# Patient Record
Sex: Male | Born: 1940
Health system: Southern US, Community
[De-identification: ages and names within clinical notes are randomized; demographics above are authoritative.]

## PROBLEM LIST (undated history)

## (undated) DIAGNOSIS — Z0000 Encounter for general adult medical examination without abnormal findings: Secondary | ICD-10-CM

## (undated) DIAGNOSIS — Z860101 Personal history of adenomatous and serrated colon polyps: Secondary | ICD-10-CM

## (undated) DIAGNOSIS — G629 Polyneuropathy, unspecified: Secondary | ICD-10-CM

## (undated) DIAGNOSIS — R9431 Abnormal electrocardiogram [ECG] [EKG]: Secondary | ICD-10-CM

## (undated) DIAGNOSIS — E119 Type 2 diabetes mellitus without complications: Secondary | ICD-10-CM

## (undated) DIAGNOSIS — F17200 Nicotine dependence, unspecified, uncomplicated: Secondary | ICD-10-CM

## (undated) DIAGNOSIS — C4490 Unspecified malignant neoplasm of skin, unspecified: Secondary | ICD-10-CM

## (undated) DIAGNOSIS — K635 Polyp of colon: Secondary | ICD-10-CM

## (undated) DIAGNOSIS — Z8719 Personal history of other diseases of the digestive system: Secondary | ICD-10-CM

## (undated) DIAGNOSIS — M1990 Unspecified osteoarthritis, unspecified site: Secondary | ICD-10-CM

## (undated) DIAGNOSIS — M5430 Sciatica, unspecified side: Secondary | ICD-10-CM

## (undated) DIAGNOSIS — I10 Essential (primary) hypertension: Secondary | ICD-10-CM

## (undated) DIAGNOSIS — N189 Chronic kidney disease, unspecified: Secondary | ICD-10-CM

## (undated) DIAGNOSIS — J00 Acute nasopharyngitis [common cold]: Secondary | ICD-10-CM

## (undated) DIAGNOSIS — C61 Malignant neoplasm of prostate: Secondary | ICD-10-CM

## (undated) DIAGNOSIS — E669 Obesity, unspecified: Secondary | ICD-10-CM

## (undated) DIAGNOSIS — E785 Hyperlipidemia, unspecified: Secondary | ICD-10-CM

## (undated) DIAGNOSIS — Z86010 Personal history of colonic polyps: Secondary | ICD-10-CM

## (undated) HISTORY — DX: Encounter for general adult medical examination without abnormal findings: Z00.00

## (undated) HISTORY — DX: Unspecified osteoarthritis, unspecified site: M19.90

## (undated) HISTORY — DX: Polyp of colon: K63.5

## (undated) HISTORY — DX: Nicotine dependence, unspecified, uncomplicated: F17.200

## (undated) HISTORY — DX: Polyneuropathy, unspecified: G62.9

## (undated) HISTORY — DX: Hyperlipidemia, unspecified: E78.5

## (undated) HISTORY — PX: CATARACT EXTRACTION: SUR2

## (undated) HISTORY — DX: Sciatica, unspecified side: M54.30

## (undated) HISTORY — DX: Unspecified malignant neoplasm of skin, unspecified: C44.90

## (undated) HISTORY — DX: Personal history of other diseases of the digestive system: Z87.19

## (undated) HISTORY — DX: Obesity, unspecified: E66.9

## (undated) HISTORY — DX: Essential (primary) hypertension: I10

---

## 1968-10-01 DIAGNOSIS — T148XXA Other injury of unspecified body region, initial encounter: Secondary | ICD-10-CM

## 1968-10-01 HISTORY — PX: LEG SURGERY: SHX1003

## 1968-10-01 HISTORY — DX: Other injury of unspecified body region, initial encounter: T14.8XXA

## 1976-10-01 HISTORY — PX: APPENDECTOMY: SHX54

## 1998-09-19 ENCOUNTER — Encounter: Admission: RE | Admit: 1998-09-19 | Discharge: 1998-12-18 | Payer: Self-pay | Admitting: Internal Medicine

## 2006-03-26 ENCOUNTER — Ambulatory Visit: Payer: Self-pay | Admitting: Internal Medicine

## 2006-04-04 ENCOUNTER — Ambulatory Visit: Payer: Self-pay | Admitting: Gastroenterology

## 2006-05-29 ENCOUNTER — Ambulatory Visit: Payer: Self-pay | Admitting: Internal Medicine

## 2006-06-05 ENCOUNTER — Ambulatory Visit: Payer: Self-pay | Admitting: Internal Medicine

## 2008-05-14 ENCOUNTER — Ambulatory Visit: Payer: Self-pay | Admitting: Internal Medicine

## 2008-05-14 DIAGNOSIS — L570 Actinic keratosis: Secondary | ICD-10-CM | POA: Insufficient documentation

## 2008-05-14 DIAGNOSIS — Z860100 Personal history of colon polyps, unspecified: Secondary | ICD-10-CM | POA: Insufficient documentation

## 2008-05-14 DIAGNOSIS — I10 Essential (primary) hypertension: Secondary | ICD-10-CM

## 2008-05-18 ENCOUNTER — Ambulatory Visit: Payer: Self-pay | Admitting: Internal Medicine

## 2008-05-18 ENCOUNTER — Encounter: Payer: Self-pay | Admitting: Internal Medicine

## 2008-05-18 LAB — CONVERTED CEMR LAB
ALT: 34 units/L (ref 0–53)
AST: 29 units/L (ref 0–37)
Albumin: 3.9 g/dL (ref 3.5–5.2)
BUN: 23 mg/dL (ref 6–23)
Bilirubin Urine: NEGATIVE
CO2: 27 meq/L (ref 19–32)
Creatinine, Ser: 1.4 mg/dL (ref 0.4–1.5)
Eosinophils Absolute: 0.1 10*3/uL (ref 0.0–0.7)
GFR calc Af Amer: 65 mL/min
GFR calc non Af Amer: 54 mL/min
Glucose, Bld: 98 mg/dL (ref 70–99)
HCT: 43.3 % (ref 39.0–52.0)
HDL: 35.4 mg/dL — ABNORMAL LOW (ref >39.0)
Hemoglobin, Urine: NEGATIVE
Monocytes Relative: 5.8 % (ref 3.0–12.0)
Neutro Abs: 3.8 10*3/uL (ref 1.4–7.7)
Potassium: 4.3 meq/L (ref 3.5–5.1)
Total Bilirubin: 1.3 mg/dL — ABNORMAL HIGH (ref 0.3–1.2)
Total CHOL/HDL Ratio: 4.7
Total Protein, Urine: NEGATIVE mg/dL
Total Protein: 6.7 g/dL (ref 6.0–8.3)
Triglycerides: 142 mg/dL (ref 0–149)
Urobilinogen, UA: 0.2 (ref 0.0–1.0)
VLDL: 28 mg/dL (ref 0–40)

## 2008-05-20 LAB — CONVERTED CEMR LAB: Vit D, 1,25-Dihydroxy: 32 (ref 30–89)

## 2008-06-10 ENCOUNTER — Ambulatory Visit: Payer: Self-pay | Admitting: Gastroenterology

## 2008-06-21 ENCOUNTER — Telehealth: Payer: Self-pay | Admitting: Gastroenterology

## 2008-06-24 ENCOUNTER — Ambulatory Visit: Payer: Self-pay | Admitting: Gastroenterology

## 2008-09-09 ENCOUNTER — Ambulatory Visit: Payer: Self-pay | Admitting: Internal Medicine

## 2008-09-09 ENCOUNTER — Encounter: Payer: Self-pay | Admitting: Internal Medicine

## 2008-09-09 DIAGNOSIS — R05 Cough: Secondary | ICD-10-CM

## 2008-09-09 DIAGNOSIS — J209 Acute bronchitis, unspecified: Secondary | ICD-10-CM

## 2008-10-28 ENCOUNTER — Ambulatory Visit: Payer: Self-pay | Admitting: Internal Medicine

## 2008-11-04 ENCOUNTER — Ambulatory Visit: Payer: Self-pay | Admitting: Internal Medicine

## 2008-11-04 LAB — CONVERTED CEMR LAB
ALT: 75 units/L — ABNORMAL HIGH (ref 0–53)
Alkaline Phosphatase: 52 units/L (ref 39–117)
CO2: 0 meq/L — CL (ref 19–32)
Calcium: 10.1 mg/dL (ref 8.4–10.5)
Cholesterol: 194 mg/dL (ref 0–200)
Creatinine, Ser: 1.4 mg/dL (ref 0.4–1.5)
GFR calc non Af Amer: 54 mL/min
HDL: 44.2 mg/dL (ref >39.0)
Hgb A1c MFr Bld: 5.5 % (ref 4.6–6.0)
LDL Cholesterol: 120 mg/dL — ABNORMAL HIGH (ref 0–99)
Microalb Creat Ratio: 5.5 mg/g (ref 0.0–30.0)
Microalb, Ur: 0.7 mg/dL (ref 0.0–1.9)
Potassium: 4.8 meq/L (ref 3.5–5.1)
Sodium: 143 meq/L (ref 135–145)
TSH: 1.02 microintl units/mL (ref 0.35–5.50)
Total CHOL/HDL Ratio: 4.4
VLDL: 30 mg/dL (ref 0–40)

## 2008-11-05 ENCOUNTER — Encounter: Payer: Self-pay | Admitting: Internal Medicine

## 2009-01-27 ENCOUNTER — Ambulatory Visit: Payer: Self-pay | Admitting: Internal Medicine

## 2009-01-27 DIAGNOSIS — R748 Abnormal levels of other serum enzymes: Secondary | ICD-10-CM | POA: Insufficient documentation

## 2009-01-31 ENCOUNTER — Encounter: Payer: Self-pay | Admitting: Internal Medicine

## 2009-02-21 ENCOUNTER — Ambulatory Visit: Payer: Self-pay | Admitting: Internal Medicine

## 2009-02-21 DIAGNOSIS — J018 Other acute sinusitis: Secondary | ICD-10-CM | POA: Insufficient documentation

## 2009-02-21 DIAGNOSIS — K8020 Calculus of gallbladder without cholecystitis without obstruction: Secondary | ICD-10-CM | POA: Insufficient documentation

## 2009-02-23 ENCOUNTER — Ambulatory Visit: Payer: Self-pay | Admitting: Diagnostic Radiology

## 2009-02-23 ENCOUNTER — Ambulatory Visit (HOSPITAL_BASED_OUTPATIENT_CLINIC_OR_DEPARTMENT_OTHER): Admission: RE | Admit: 2009-02-23 | Discharge: 2009-02-23 | Payer: Self-pay | Admitting: Internal Medicine

## 2009-02-25 ENCOUNTER — Telehealth: Payer: Self-pay | Admitting: Internal Medicine

## 2010-05-01 ENCOUNTER — Telehealth: Payer: Self-pay | Admitting: Internal Medicine

## 2010-05-01 ENCOUNTER — Ambulatory Visit: Payer: Self-pay | Admitting: Internal Medicine

## 2010-05-01 DIAGNOSIS — R7309 Other abnormal glucose: Secondary | ICD-10-CM

## 2010-05-01 LAB — CONVERTED CEMR LAB
CO2: 25 meq/L (ref 19–32)
Chloride: 106 meq/L (ref 96–112)
Creatinine, Ser: 1.62 mg/dL — ABNORMAL HIGH (ref 0.40–1.50)
Potassium: 4.7 meq/L (ref 3.5–5.3)
Sodium: 141 meq/L (ref 135–145)

## 2010-05-02 ENCOUNTER — Telehealth: Payer: Self-pay | Admitting: Internal Medicine

## 2010-05-02 ENCOUNTER — Ambulatory Visit: Payer: Self-pay | Admitting: Radiology

## 2010-05-02 ENCOUNTER — Ambulatory Visit (HOSPITAL_BASED_OUTPATIENT_CLINIC_OR_DEPARTMENT_OTHER): Admission: RE | Admit: 2010-05-02 | Discharge: 2010-05-02 | Payer: Self-pay | Admitting: Internal Medicine

## 2010-05-02 DIAGNOSIS — N259 Disorder resulting from impaired renal tubular function, unspecified: Secondary | ICD-10-CM | POA: Insufficient documentation

## 2010-10-02 ENCOUNTER — Encounter: Payer: Self-pay | Admitting: Internal Medicine
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
## 2010-10-31 NOTE — Progress Notes (Signed)
Summary: [facility] refill  Phone Note Refill Request Call back at Home Phone [phone] Message from:  Patient on May 01, 2010 12:30 PM  Refills Requested: Medication #1:  VASOTEC 20 MG  TABS once daily Pt needs refill sent to [facility] [place]  Initial call taken by: Lannette Donath,  May 01, 2010 12:30 PM Caller: Patient  Follow-up for Phone Call        call returned to patient at [phone], no answer. A detailed voice message was left informing patient his last office visit was in May of 2010. His refill request for Vasotec requires an office visit. Follow-up by: Glendell Docker CMA,  May 01, 2010 1:56 PM

## 2010-10-31 NOTE — Progress Notes (Signed)
Summary: lab result  Phone Note Outgoing Call   Summary of Call: call pt - blood test shows kidney function got slightly worse.  please make sure pt not taking any OTC NSAIDs.  Pt needs to come in renal u/s Initial call taken by: D. Thomos Lemons DO,  May 02, 2010 8:20 AM  Follow-up for Phone Call        Pt notified per Dr. Olegario Messier instructions and transferred to [facility] to confirm appt.  Nicki Guadalajara Fergerson CMA Duncan Dull)  May 02, 2010 8:51 AM   New Problems: RENAL INSUFFICIENCY (ICD-588.9)   New Problems: RENAL INSUFFICIENCY (ICD-588.9)

## 2010-10-31 NOTE — Progress Notes (Signed)
Summary: NSAID use  Phone Note Outgoing Call   Summary of Call: call pt - no acute changes on kidney u/s .  renal insufficiency likely from long standing hypertension.   I suggest we change his BP meds.  see rx.  He needs f/u BMET in one month before OV.   Please stress to pt,  he CANNOT use any NSAIDs.  tylenol only for headaches or aches and pains  if he already picked up enalapril 20 mg,  have him cut those tabs in half Initial call taken by: D. Thomos Lemons DO,  May 02, 2010 12:16 PM  Follow-up for Phone Call        Pt advised per Dr Olegario Messier instructions. Per pt request: cancelled refill on [facility]ril at [facility] and sent 90 day supply to [facility]. Also sent 90 day supply of Amlodipine to [facility], he will pick up 30 day supply from [facility].  Pt is currently taking an ASA 81mg  daily.  Should he stop this as well?  Nicki Guadalajara Fergerson CMA Duncan Dull)  May 02, 2010 4:21 PM   Additional Follow-up for Phone Call Additional follow up Details #1::        no,  he can continue aspirin Additional Follow-up by: D. Thomos Lemons DO,  May 02, 2010 6:00 PM    Additional Follow-up for Phone Call Additional follow up Details #2::    Left message on machine to return my call. Nicki Guadalajara Fergerson CMA Duncan Dull)  May 03, 2010 8:53 AM   Additional Follow-up for Phone Call Additional follow up Details #3:: Details for Additional Follow-up Action Taken: patient called back for medication clarification, he has been advised per Dr Artist Pais instructions Additional Follow-up by: Glendell Docker CMA,  May 03, 2010 11:18 AM  New/Updated Medications: ENALAPRIL MALEATE 10 MG TABS (ENALAPRIL MALEATE) one by mouth once daily AMLODIPINE BESYLATE 2.5 MG TABS (AMLODIPINE BESYLATE) one by mouth once daily Prescriptions: AMLODIPINE BESYLATE 2.5 MG TABS (AMLODIPINE BESYLATE) one by mouth once daily  #90 x 1   Entered by:   Mervin Kung CMA (AAMA)   Authorized by:   D. Thomos Lemons DO   Signed by:   Mervin Kung CMA (AAMA) on  05/02/2010   Method used:   Faxed to ...       [facility] (mail-order)             , [place]         Ph: [phone]       Fax: [phone]   RxID:   [id number] ENALAPRIL MALEATE 10 MG TABS (ENALAPRIL MALEATE) one by mouth once daily  #90 x 1   Entered by:   Mervin Kung CMA (AAMA)   Authorized by:   D. Thomos Lemons DO   Signed by:   Mervin Kung CMA (AAMA) on 05/02/2010   Method used:   Faxed to ...       [facility] (mail-order)             , [place]         Ph: [phone]       Fax: [phone]   RxID:   [id number] AMLODIPINE BESYLATE 2.5 MG TABS (AMLODIPINE BESYLATE) one by mouth once daily  #30 x 2   Entered and Authorized by:   D. Thomos Lemons DO   Signed by:   D. Thomos Lemons DO on 05/02/2010   Method used:   [facility]  [place]  [address] [phone][facility] (retail)       [address]       [place], [place]  [address]       Ph: [phone]       Fax: [phone]   RxID:   [id number] ENALAPRIL MALEATE 10 MG TABS (ENALAPRIL MALEATE) one by mouth once daily  #90 x 1   Entered and Authorized by:   D. Thomos Lemons DO   Signed by:   D. Thomos Lemons DO on 05/02/2010   Method used:   [facility]        [facility] [phone][facility] (retail)       [address]       [place], [place]  [address]       Ph: [phone]       Fax: [phone]   RxID:   [id number]

## 2010-11-02 NOTE — Medication Information (Signed)
Summary: Nonadherence with Amlodipine/[employer]  Nonadherence with Amlodipine/[employer]   Imported By: Lanelle Bal 10/17/2010 10:18:23  _____________________________________________________________________  External Attachment:    Type:   Image     Comment:   External Document

## 2011-02-16 NOTE — Assessment & Plan Note (Signed)
[facility]                             PRIMARY CARE OFFICE NOTE   Edward Hernandez, Edward Hernandez                       MRN:          [id number]  DATE:06/05/2006                            DOB:          [birth year]    REASON FOR VISIT:  The patient is a 70 year old male who presents for a  wellness examination.   ALLERGIES:  No known drug allergies.   PAST MEDICAL HISTORY:  Type 2 diabetes, urinary tract infection,  hypertension.   FAMILY HISTORY:  Coronary disease, diverticulosis, small colon polyps.   CURRENT MEDICATIONS:  Enalapril 20 mg daily; baby aspirin daily; folic acid  daily; Amaryl 1 mg daily.   SOCIAL HISTORY:  He is married.  He has retired, exercising six days a week.  He smokes two cigars a day, no cigarettes.  No alcohol.   REVIEW OF SYSTEMS:  No chest pain or shortness of breath.  No syncope.  No  neurologic complaints.  No urinary problems lately.  The rest is negative.   PHYSICAL EXAMINATION:  VITAL SIGNS:  Blood pressure 136/80 mmHg, pulse 73,  temperature 99 degrees, weight 207 pounds.  GENERAL:  He looks well.  HEENT:  With moist mucosa.  NECK:  Supple.  No thyromegaly or bruits.  LUNGS:  Clear.  No wheezes or rales.  HEART:  S1/S2, no murmur, no gallop.  ABDOMEN:  Soft, nontender.  No organomegaly or masses felt.  EXTREMITIES:  Lower extremities without edema.  NEUROLOGIC:  He is alert, oriented, and cooperative.  He denies being  depression.  RECTAL EXAMINATION:  Reveals slightly large prostate.  No masses.  No  nodule.  No tenderness.  Stool guaiac negative.  SKIN EXAM:  Revealed normal aging changes, with some actinic keratosis on  the face.   LABORATORY DATA:  Labs, 05/29/2006:  CBC normal.  Glucose 108.  Cholesterol  173, LDL 110, HDL 35.  A1c 5.  TSH normal.   EKG:  Normal sinus rhythm.   ASSESSMENT AND PLAN:  Normal wellness examination.  Age/health related  issues  discussed.  Healthy lifestyle discussed.  Repeat  exam in 12 months.  He will try to loose a little more weight.                                   Georgina Quint. Plotnikov, MD   [facility]P/[facility]  DD:  06/06/2006  DT:  06/06/2006  Job #:  [id number]

## 2011-02-16 NOTE — Assessment & Plan Note (Signed)
Edward Hernandez                             PRIMARY CARE OFFICE NOTE   Edward Hernandez, Edward Hernandez                       MRN:          [id number]  DATE:06/05/2006                            DOB:          [birth year]    The patient is 69 years old.  He presents today to address a number of  chronic medical problems with him including diabetes, hypertension,  dyslipidemia.  We also need to address problems with his elevated PSA of 3.7  and possible prostatitis.  We need to discuss his previous finding of  gallstones on the ultrasound.   PAST MEDICAL HISTORY:  Diabetes, hypertension, diverticulosis, colon polyp.   SOCIAL HISTORY:  He is retired, exercising a lot.   FAMILY HISTORY:  Positive for coronary disease.   ALLERGIES:  None.   CURRENT MEDICATIONS:  Include enalapril, baby aspirin, and Amaryl.   REVIEW OF SYSTEMS:  No nausea, vomiting.  No right upper quadrant pain.  No  urinary symptoms.  No weight loss.  The rest is negative.   PHYSICAL EXAMINATION:  VITAL SIGNS:  Blood pressure 136/80 mmHg, pulse 73,  temperature 99 degrees, weight 207 pounds.  GENERAL:  He is in no acute distress, looks well.  HEENT:  With moist mucosa.  NECK:  Supple.  No thyromegaly.  LUNGS:  No wheeze or rales.  HEART:  S1/S2, no murmur, no gallop.  ABDOMEN:  Soft, nontender.  No organomegaly or masses palpable.  EXTREMITIES:  Lower extremities without edema.  NEUROLOGIC:  He is alert, oriented, and cooperative.  SKIN:  Clear, with aging changes.  Some AK.  RECTUM:  Prostate was enlarged, nontender.  Rectal normal.   LABORATORY DATA:  Labs reviewed, 05/29/2006:  PSA 3.7 (was 1).  Urinalysis  with 10-20 wbc's.   ASSESSMENT AND PLAN:  1. Type 2 diabetes.  Continue with current therapy.  A1c in three months.  2. Hypertension, controlled.  Continue with current therapy.  3. Dyslipidemia.  He does not want Statins due to previous side-effects.      Will try fish oil.  Repeat  lipids in six months.  4. Elevated prostatic specific antigen.  PSA went from 1 to 3.7.  It could      be due to recurrent prostatitis lately.  Obtain urology consultation      with Dr. Isabel Caprice.  5. Possible prostatitis versus pyelonephritis.  Previous recent history of      UTI with E. coli.  He was given Cipro 500 mg p.o. b.i.d. for 10 days.      Urology consultation with Dr. Isabel Caprice.  6. Gallstones, asymptomatic.  He is not interested in addressing it now;      however, surgical consultation was offered.                                   Georgina Quint. Plotnikov, MD   AVP/MedQ  DD:  06/06/2006  DT:  06/06/2006  Job #:  [id number]   cc:   Valetta Fuller, M.D.

## 2011-08-27 ENCOUNTER — Telehealth: Payer: Self-pay | Admitting: Internal Medicine

## 2011-08-27 NOTE — Telephone Encounter (Signed)
Pt needs OV 

## 2011-08-27 NOTE — Telephone Encounter (Signed)
Left message for pt to call back and schedule ov

## 2011-08-27 NOTE — Telephone Encounter (Signed)
Refill-enalapril maleate 10mg  tab. Take one tablet by mouth every day. Qty 90 last fill 7.30.12

## 2011-08-27 NOTE — Telephone Encounter (Signed)
Refill-amlodopine besylate 2.5mg  tab. Take one tablet by mouth every day. Qty 30 last fill 7.30.12

## 2011-09-14 ENCOUNTER — Ambulatory Visit (INDEPENDENT_AMBULATORY_CARE_PROVIDER_SITE_OTHER): Payer: Medicare Other | Admitting: Internal Medicine

## 2011-09-14 ENCOUNTER — Telehealth: Payer: Self-pay | Admitting: Internal Medicine

## 2011-09-14 ENCOUNTER — Encounter: Payer: Self-pay | Admitting: Internal Medicine

## 2011-09-14 DIAGNOSIS — N259 Disorder resulting from impaired renal tubular function, unspecified: Secondary | ICD-10-CM

## 2011-09-14 DIAGNOSIS — K769 Liver disease, unspecified: Secondary | ICD-10-CM

## 2011-09-14 DIAGNOSIS — E119 Type 2 diabetes mellitus without complications: Secondary | ICD-10-CM

## 2011-09-14 DIAGNOSIS — R945 Abnormal results of liver function studies: Secondary | ICD-10-CM

## 2011-09-14 DIAGNOSIS — R7309 Other abnormal glucose: Secondary | ICD-10-CM

## 2011-09-14 DIAGNOSIS — R748 Abnormal levels of other serum enzymes: Secondary | ICD-10-CM

## 2011-09-14 DIAGNOSIS — I10 Essential (primary) hypertension: Secondary | ICD-10-CM

## 2011-09-14 MED ORDER — ENALAPRIL MALEATE 10 MG PO TABS: 10.0000 mg | ORAL_TABLET | Freq: Every day | ORAL | Status: DC

## 2011-09-14 MED ORDER — AMLODIPINE BESYLATE 2.5 MG PO TABS: 2.5000 mg | ORAL_TABLET | Freq: Every day | ORAL | Status: DC

## 2011-09-14 NOTE — Patient Instructions (Signed)
Please schedule lipid, chem7, a1c 250.0 prior to next visit

## 2011-09-14 NOTE — Assessment & Plan Note (Signed)
suboptimal control. Take medications daily and log outpt blood pressure values.

## 2011-09-14 NOTE — Assessment & Plan Note (Signed)
Unknown control off medication. Obtain cbc, chem7, a1c and urine microalbumin

## 2011-09-14 NOTE — Assessment & Plan Note (Signed)
Repeat lft

## 2011-09-14 NOTE — Telephone Encounter (Signed)
Lab orders entered for March 2013. 
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
## 2011-09-14 NOTE — Assessment & Plan Note (Signed)
Reinforced need for tight bp control. Avoid nsaids. Obtain chem7

## 2011-09-15 LAB — CBC
MCHC: 33.6 g/dL (ref 30.0–36.0)
MCV: 95.1 fL (ref 78.0–100.0)
Platelets: 201 10*3/uL (ref 150–400)
RDW: 13 % (ref 11.5–15.5)

## 2011-09-15 LAB — HEPATIC FUNCTION PANEL
AST: 88 U/L — ABNORMAL HIGH (ref 0–37)
Bilirubin, Direct: 0.1 mg/dL (ref 0.0–0.3)
Total Bilirubin: 0.7 mg/dL (ref 0.3–1.2)
Total Protein: 6.8 g/dL (ref 6.0–8.3)

## 2011-09-15 LAB — BASIC METABOLIC PANEL
CO2: 25 mEq/L (ref 19–32)
Calcium: 10.1 mg/dL (ref 8.4–10.5)

## 2011-09-15 LAB — MICROALBUMIN / CREATININE URINE RATIO: Microalb, Ur: 1.02 mg/dL (ref 0.00–1.89)

## 2011-09-15 LAB — HEMOGLOBIN A1C: Mean Plasma Glucose: 131 mg/dL — ABNORMAL HIGH (ref <117)

## 2011-09-26 ENCOUNTER — Other Ambulatory Visit: Payer: Self-pay | Admitting: Internal Medicine

## 2011-09-26 DIAGNOSIS — R945 Abnormal results of liver function studies: Secondary | ICD-10-CM

## 2011-10-02 HISTORY — PX: CATARACT EXTRACTION W/ INTRAOCULAR LENS IMPLANT: SHX1309

## 2011-10-09 DIAGNOSIS — R945 Abnormal results of liver function studies: Secondary | ICD-10-CM | POA: Diagnosis not present

## 2011-10-09 DIAGNOSIS — R7989 Other specified abnormal findings of blood chemistry: Secondary | ICD-10-CM | POA: Diagnosis not present

## 2011-10-09 LAB — HEPATIC FUNCTION PANEL
ALT: 112 U/L — ABNORMAL HIGH (ref 0–53)
Albumin: 4.6 g/dL (ref 3.5–5.2)
Alkaline Phosphatase: 54 U/L (ref 39–117)
Bilirubin, Direct: 0.1 mg/dL (ref 0.0–0.3)
Total Bilirubin: 0.8 mg/dL (ref 0.3–1.2)
Total Protein: 7.3 g/dL (ref 6.0–8.3)

## 2011-10-09 NOTE — Progress Notes (Signed)
Addended by: Mervin Kung A on: 10/09/2011 02:01 PM   Modules accepted: Orders

## 2011-10-10 LAB — IRON: Iron: 79 ug/dL (ref 42–165)

## 2011-10-10 LAB — HEPATITIS PANEL, ACUTE
HCV Ab: NEGATIVE
Hepatitis B Surface Ag: NEGATIVE

## 2011-10-18 ENCOUNTER — Other Ambulatory Visit: Payer: Self-pay | Admitting: Internal Medicine

## 2011-10-18 DIAGNOSIS — R748 Abnormal levels of other serum enzymes: Secondary | ICD-10-CM

## 2011-10-19 DIAGNOSIS — H2510 Age-related nuclear cataract, unspecified eye: Secondary | ICD-10-CM | POA: Diagnosis not present

## 2011-10-23 ENCOUNTER — Ambulatory Visit (HOSPITAL_BASED_OUTPATIENT_CLINIC_OR_DEPARTMENT_OTHER)
Admission: RE | Admit: 2011-10-23 | Discharge: 2011-10-23 | Disposition: A | Discharge status: Home / Self Care | Payer: Medicare Other | Source: Ambulatory Visit | Attending: Internal Medicine | Admitting: Internal Medicine

## 2011-10-23 DIAGNOSIS — R748 Abnormal levels of other serum enzymes: Secondary | ICD-10-CM

## 2011-10-23 DIAGNOSIS — R7989 Other specified abnormal findings of blood chemistry: Secondary | ICD-10-CM

## 2011-10-23 DIAGNOSIS — K8020 Calculus of gallbladder without cholecystitis without obstruction: Secondary | ICD-10-CM | POA: Insufficient documentation

## 2011-10-23 DIAGNOSIS — R945 Abnormal results of liver function studies: Secondary | ICD-10-CM | POA: Diagnosis not present

## 2011-11-04 ENCOUNTER — Other Ambulatory Visit: Payer: Self-pay | Admitting: Internal Medicine

## 2011-11-04 DIAGNOSIS — R945 Abnormal results of liver function studies: Secondary | ICD-10-CM

## 2011-11-05 ENCOUNTER — Encounter: Payer: Self-pay | Admitting: Gastroenterology

## 2011-11-09 ENCOUNTER — Ambulatory Visit: Payer: Medicare Other | Admitting: Gastroenterology

## 2011-11-12 DIAGNOSIS — H524 Presbyopia: Secondary | ICD-10-CM | POA: Diagnosis not present

## 2011-11-12 DIAGNOSIS — H43399 Other vitreous opacities, unspecified eye: Secondary | ICD-10-CM | POA: Diagnosis not present

## 2011-11-12 DIAGNOSIS — H5210 Myopia, unspecified eye: Secondary | ICD-10-CM | POA: Diagnosis not present

## 2011-11-12 DIAGNOSIS — H269 Unspecified cataract: Secondary | ICD-10-CM | POA: Diagnosis not present

## 2011-11-12 DIAGNOSIS — H2510 Age-related nuclear cataract, unspecified eye: Secondary | ICD-10-CM | POA: Diagnosis not present

## 2011-12-13 ENCOUNTER — Ambulatory Visit: Payer: Medicare Other | Admitting: Internal Medicine

## 2011-12-18 ENCOUNTER — Ambulatory Visit (INDEPENDENT_AMBULATORY_CARE_PROVIDER_SITE_OTHER): Payer: Medicare Other | Admitting: Gastroenterology

## 2011-12-18 ENCOUNTER — Encounter: Payer: Self-pay | Admitting: Gastroenterology

## 2011-12-18 VITALS — BP 130/78 | HR 89 | Ht 68.0 in | Wt 219.6 lb

## 2011-12-18 DIAGNOSIS — R748 Abnormal levels of other serum enzymes: Secondary | ICD-10-CM

## 2011-12-18 DIAGNOSIS — K8020 Calculus of gallbladder without cholecystitis without obstruction: Secondary | ICD-10-CM

## 2011-12-18 NOTE — Assessment & Plan Note (Signed)
In the past he's had abdominal pain although he does not recall on which side. He currently has no symptoms referable to his gallbladder.

## 2011-12-18 NOTE — Assessment & Plan Note (Signed)
LFT abnormalities are most likely due to hepatic steatosis. Although there is a potential for Edward Hernandez, this cannot be definitively ascertained unless he underwent liver biopsy. I do not recommend this at this time.
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
## 2011-12-18 NOTE — Patient Instructions (Addendum)
You have been given a separate informational sheet regarding your tobacco use, the importance of quitting and local resources to help you quit. Call back as needed 

## 2012-01-01 ENCOUNTER — Encounter: Payer: Self-pay | Admitting: Internal Medicine

## 2012-01-01 ENCOUNTER — Ambulatory Visit (INDEPENDENT_AMBULATORY_CARE_PROVIDER_SITE_OTHER): Payer: Medicare Other | Admitting: Internal Medicine

## 2012-01-01 VITALS — BP 136/80 | HR 87 | Temp 97.4°F | Resp 20 | Wt 219.0 lb

## 2012-01-01 DIAGNOSIS — J40 Bronchitis, not specified as acute or chronic: Secondary | ICD-10-CM | POA: Diagnosis not present

## 2012-01-01 MED ORDER — DOXYCYCLINE HYCLATE 100 MG PO TABS: 100.0000 mg | ORAL_TABLET | Freq: Two times a day (BID) | ORAL | Status: AC
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
## 2012-01-01 NOTE — Assessment & Plan Note (Signed)
Begin abx course. Followup if no improvement or worsening. 

## 2012-01-17 ENCOUNTER — Ambulatory Visit: Payer: Medicare Other | Admitting: Internal Medicine

## 2012-04-09 ENCOUNTER — Other Ambulatory Visit: Payer: Self-pay | Admitting: Internal Medicine

## 2012-12-25 DIAGNOSIS — Z85828 Personal history of other malignant neoplasm of skin: Secondary | ICD-10-CM | POA: Diagnosis not present

## 2012-12-25 DIAGNOSIS — L570 Actinic keratosis: Secondary | ICD-10-CM | POA: Diagnosis not present

## 2012-12-26 DIAGNOSIS — H43399 Other vitreous opacities, unspecified eye: Secondary | ICD-10-CM | POA: Diagnosis not present

## 2012-12-26 DIAGNOSIS — H269 Unspecified cataract: Secondary | ICD-10-CM | POA: Diagnosis not present

## 2012-12-26 DIAGNOSIS — Z961 Presence of intraocular lens: Secondary | ICD-10-CM | POA: Diagnosis not present

## 2013-01-22 DIAGNOSIS — M25579 Pain in unspecified ankle and joints of unspecified foot: Secondary | ICD-10-CM | POA: Diagnosis not present

## 2013-02-07 ENCOUNTER — Encounter: Payer: Self-pay | Admitting: Internal Medicine

## 2013-03-05 DIAGNOSIS — M25569 Pain in unspecified knee: Secondary | ICD-10-CM | POA: Diagnosis not present

## 2013-03-20 ENCOUNTER — Encounter: Payer: Self-pay | Admitting: Family Medicine

## 2013-03-20 ENCOUNTER — Ambulatory Visit (INDEPENDENT_AMBULATORY_CARE_PROVIDER_SITE_OTHER): Payer: Medicare Other | Admitting: Family Medicine

## 2013-03-20 VITALS — BP 130/80 | HR 76 | Temp 97.6°F | Ht 68.0 in | Wt 212.1 lb

## 2013-03-20 DIAGNOSIS — I10 Essential (primary) hypertension: Secondary | ICD-10-CM

## 2013-03-20 DIAGNOSIS — R7309 Other abnormal glucose: Secondary | ICD-10-CM | POA: Diagnosis not present

## 2013-03-20 DIAGNOSIS — N259 Disorder resulting from impaired renal tubular function, unspecified: Secondary | ICD-10-CM | POA: Diagnosis not present

## 2013-03-20 DIAGNOSIS — R748 Abnormal levels of other serum enzymes: Secondary | ICD-10-CM

## 2013-03-20 LAB — RENAL FUNCTION PANEL
Albumin: 4.8 g/dL (ref 3.5–5.2)
BUN: 26 mg/dL — ABNORMAL HIGH (ref 6–23)
Chloride: 109 mEq/L (ref 96–112)
Creat: 1.76 mg/dL — ABNORMAL HIGH (ref 0.50–1.35)
Glucose, Bld: 82 mg/dL (ref 70–99)
Phosphorus: 3.9 mg/dL (ref 2.3–4.6)
Potassium: 5.1 mEq/L (ref 3.5–5.3)
Sodium: 142 mEq/L (ref 135–145)

## 2013-03-20 LAB — CBC
MCHC: 33.5 g/dL (ref 30.0–36.0)
Platelets: 203 10*3/uL (ref 150–400)

## 2013-03-20 LAB — HEPATIC FUNCTION PANEL
Bilirubin, Direct: 0.1 mg/dL (ref 0.0–0.3)
Indirect Bilirubin: 0.7 mg/dL (ref 0.0–0.9)
Total Protein: 7.4 g/dL (ref 6.0–8.3)

## 2013-03-20 MED ORDER — AMLODIPINE BESYLATE 2.5 MG PO TABS: 2.5000 mg | ORAL_TABLET | Freq: Every day | ORAL | Status: DC

## 2013-03-20 MED ORDER — ENALAPRIL MALEATE 10 MG PO TABS: 10.0000 mg | ORAL_TABLET | Freq: Every day | ORAL | Status: DC

## 2013-03-20 NOTE — Patient Instructions (Addendum)
Next visit annual with labs prior lipid, liver, renal, cbc, tsh  Start a probiotic such as Digestive Advantage daily  Hypertension As your heart beats, it forces blood through your arteries. This force is your blood pressure. If the pressure is too high, it is called hypertension (HTN) or high blood pressure. HTN is dangerous because you may have it and not know it. High blood pressure may mean that your heart has to work harder to pump blood. Your arteries may be narrow or stiff. The extra work puts you at risk for heart disease, stroke, and other problems.  Blood pressure consists of two numbers, a higher number over a lower, 110/72, for example. It is stated as "110 over 72." The ideal is below 120 for the top number (systolic) and under 80 for the bottom (diastolic). Write down your blood pressure today. You should pay close attention to your blood pressure if you have certain conditions such as:  Heart failure.  Prior heart attack.  Diabetes  Chronic kidney disease.  Prior stroke.  Multiple risk factors for heart disease. To see if you have HTN, your blood pressure should be measured while you are seated with your arm held at the level of the heart. It should be measured at least twice. A one-time elevated blood pressure reading (especially in the Emergency Department) does not mean that you need treatment. There may be conditions in which the blood pressure is different between your right and left arms. It is important to see your caregiver soon for a recheck. Most people have essential hypertension which means that there is not a specific cause. This type of high blood pressure may be lowered by changing lifestyle factors such as:  Stress.  Smoking.  Lack of exercise.  Excessive weight.  Drug/tobacco/alcohol use.  Eating less salt. Most people do not have symptoms from high blood pressure until it has caused damage to the body. Effective treatment can often prevent, delay or  reduce that damage. TREATMENT  When a cause has been identified, treatment for high blood pressure is directed at the cause. There are a large number of medications to treat HTN. These fall into several categories, and your caregiver will help you select the medicines that are best for you. Medications may have side effects. You should review side effects with your caregiver. If your blood pressure stays high after you have made lifestyle changes or started on medicines,   Your medication(s) may need to be changed.  Other problems may need to be addressed.  Be certain you understand your prescriptions, and know how and when to take your medicine.  Be sure to follow up with your caregiver within the time frame advised (usually within two weeks) to have your blood pressure rechecked and to review your medications.  If you are taking more than one medicine to lower your blood pressure, make sure you know how and at what times they should be taken. Taking two medicines at the same time can result in blood pressure that is too low. SEEK IMMEDIATE MEDICAL CARE IF:  You develop a severe headache, blurred or changing vision, or confusion.  You have unusual weakness or numbness, or a faint feeling.  You have severe chest or abdominal pain, vomiting, or breathing problems. MAKE SURE YOU:   Understand these instructions.  Will watch your condition.  Will get help right away if you are not doing well or get worse. Document Released: 09/17/2005 Document Revised: 12/10/2011 Document Reviewed: 05/07/2008 Exit[employer]  Patient Information 2014 [employer].  

## 2013-03-22 ENCOUNTER — Encounter: Payer: Self-pay | Admitting: Family Medicine

## 2013-03-22 NOTE — Assessment & Plan Note (Signed)
Slowly worsening, encouraged increaesed hydration and repeat lab work, may need to change or decrease dose of ACE at next blood draw if persistently climbing

## 2013-03-22 NOTE — Assessment & Plan Note (Signed)
Well controlled at today's visit, no changes, refills given

## 2013-03-22 NOTE — Assessment & Plan Note (Signed)
Normalized
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
## 2013-03-22 NOTE — Assessment & Plan Note (Signed)
Encouraged avoidance of simple carbs, continue current meds for now and patient agrees to return for labs prior to next visit

## 2013-06-16 ENCOUNTER — Ambulatory Visit: Payer: Medicare Other | Admitting: Family Medicine

## 2013-08-11 ENCOUNTER — Telehealth: Payer: Self-pay | Admitting: *Deleted

## 2013-08-11 DIAGNOSIS — I10 Essential (primary) hypertension: Secondary | ICD-10-CM

## 2013-08-11 DIAGNOSIS — R7309 Other abnormal glucose: Secondary | ICD-10-CM

## 2013-08-11 DIAGNOSIS — N259 Disorder resulting from impaired renal tubular function, unspecified: Secondary | ICD-10-CM

## 2013-08-11 DIAGNOSIS — Z79899 Other long term (current) drug therapy: Secondary | ICD-10-CM

## 2013-08-11 DIAGNOSIS — R791 Abnormal coagulation profile: Secondary | ICD-10-CM

## 2013-08-11 DIAGNOSIS — R739 Hyperglycemia, unspecified: Secondary | ICD-10-CM

## 2013-08-11 LAB — CBC
MCH: 31.2 pg (ref 26.0–34.0)
MCV: 90.9 fL (ref 78.0–100.0)
Platelets: 193 10*3/uL (ref 150–400)
RDW: 13.5 % (ref 11.5–15.5)

## 2013-08-11 LAB — HEPATIC FUNCTION PANEL
Albumin: 4.1 g/dL (ref 3.5–5.2)
Alkaline Phosphatase: 67 U/L (ref 39–117)
Indirect Bilirubin: 0.7 mg/dL (ref 0.0–0.9)
Total Bilirubin: 0.9 mg/dL (ref 0.3–1.2)

## 2013-08-11 LAB — BASIC METABOLIC PANEL
BUN: 27 mg/dL — ABNORMAL HIGH (ref 6–23)
Chloride: 109 mEq/L (ref 96–112)
Glucose, Bld: 106 mg/dL — ABNORMAL HIGH (ref 70–99)
Potassium: 4.5 mEq/L (ref 3.5–5.3)

## 2013-08-11 LAB — TSH: TSH: 1.207 u[IU]/mL (ref 0.350–4.500)

## 2013-08-11 NOTE — Telephone Encounter (Signed)
Pt presented to lab, orders placed, forwarded to [facility]/SLS

## 2013-08-13 ENCOUNTER — Encounter: Payer: Self-pay | Admitting: Family Medicine

## 2013-08-13 ENCOUNTER — Ambulatory Visit (INDEPENDENT_AMBULATORY_CARE_PROVIDER_SITE_OTHER): Payer: Medicare Other | Admitting: Family Medicine

## 2013-08-13 VITALS — BP 156/88 | HR 67 | Temp 98.4°F | Resp 16 | Ht 66.0 in | Wt 209.0 lb

## 2013-08-13 DIAGNOSIS — E785 Hyperlipidemia, unspecified: Secondary | ICD-10-CM

## 2013-08-13 DIAGNOSIS — R351 Nocturia: Secondary | ICD-10-CM

## 2013-08-13 DIAGNOSIS — I10 Essential (primary) hypertension: Secondary | ICD-10-CM

## 2013-08-13 DIAGNOSIS — N259 Disorder resulting from impaired renal tubular function, unspecified: Secondary | ICD-10-CM

## 2013-08-13 DIAGNOSIS — M129 Arthropathy, unspecified: Secondary | ICD-10-CM | POA: Diagnosis not present

## 2013-08-13 DIAGNOSIS — Z0000 Encounter for general adult medical examination without abnormal findings: Secondary | ICD-10-CM | POA: Diagnosis not present

## 2013-08-13 DIAGNOSIS — M1990 Unspecified osteoarthritis, unspecified site: Secondary | ICD-10-CM

## 2013-08-13 DIAGNOSIS — Z23 Encounter for immunization: Secondary | ICD-10-CM

## 2013-08-13 MED ORDER — ZOSTER VACCINE LIVE 19400 UNT/0.65ML ~~LOC~~ SOLR: 0.6500 mL | Freq: Once | SUBCUTANEOUS | Status: DC

## 2013-08-13 NOTE — Patient Instructions (Signed)

## 2013-08-13 NOTE — Progress Notes (Signed)
Pre visit review using our clinic review tool, if applicable. No additional management support is needed unless otherwise documented below in the visit note/SLS  

## 2013-08-16 ENCOUNTER — Encounter: Payer: Self-pay | Admitting: Family Medicine

## 2013-08-16 DIAGNOSIS — E785 Hyperlipidemia, unspecified: Secondary | ICD-10-CM

## 2013-08-16 DIAGNOSIS — E782 Mixed hyperlipidemia: Secondary | ICD-10-CM | POA: Insufficient documentation

## 2013-08-16 DIAGNOSIS — Z0000 Encounter for general adult medical examination without abnormal findings: Secondary | ICD-10-CM

## 2013-08-16 DIAGNOSIS — M1990 Unspecified osteoarthritis, unspecified site: Secondary | ICD-10-CM

## 2013-08-16 HISTORY — DX: Hyperlipidemia, unspecified: E78.5

## 2013-08-16 HISTORY — DX: Encounter for general adult medical examination without abnormal findings: Z00.00

## 2013-08-16 HISTORY — DX: Unspecified osteoarthritis, unspecified site: M19.90

## 2013-08-16 NOTE — Assessment & Plan Note (Signed)
Doing better at present encouraged to try topical treatments such as [facility] prn.

## 2013-08-16 NOTE — Assessment & Plan Note (Signed)
Stable, no changes to therapy today

## 2013-08-16 NOTE — Assessment & Plan Note (Signed)
Avoid trans fats, add krill oil minimize simple carbs and saturated fats and consider meds at next visit if numbers remain elevated.
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
## 2013-08-16 NOTE — Assessment & Plan Note (Signed)
Follows with [facility] 2009, repeat due in 2019, labs reviewed with patient. No advanced directives, encouraged to consider. Doing well at home no depression or falls. Performing ADLs without difficulty

## 2013-08-16 NOTE — Assessment & Plan Note (Signed)
Encouraged DASH diet and avoidance of caffeine. Continue Enalapril and Amlodipine

## 2014-01-31 ENCOUNTER — Other Ambulatory Visit: Payer: Self-pay | Admitting: Family Medicine

## 2014-06-30 DIAGNOSIS — H35349 Macular cyst, hole, or pseudohole, unspecified eye: Secondary | ICD-10-CM | POA: Diagnosis not present

## 2014-06-30 DIAGNOSIS — H2510 Age-related nuclear cataract, unspecified eye: Secondary | ICD-10-CM | POA: Diagnosis not present

## 2014-08-12 DIAGNOSIS — H35372 Puckering of macula, left eye: Secondary | ICD-10-CM | POA: Diagnosis not present

## 2014-08-12 DIAGNOSIS — H25813 Combined forms of age-related cataract, bilateral: Secondary | ICD-10-CM | POA: Diagnosis not present

## 2014-08-12 DIAGNOSIS — H25812 Combined forms of age-related cataract, left eye: Secondary | ICD-10-CM | POA: Diagnosis not present

## 2014-09-07 DIAGNOSIS — H35342 Macular cyst, hole, or pseudohole, left eye: Secondary | ICD-10-CM | POA: Diagnosis not present

## 2014-09-07 DIAGNOSIS — Z961 Presence of intraocular lens: Secondary | ICD-10-CM | POA: Diagnosis not present

## 2014-09-07 DIAGNOSIS — H2512 Age-related nuclear cataract, left eye: Secondary | ICD-10-CM | POA: Diagnosis not present

## 2014-09-07 DIAGNOSIS — I10 Essential (primary) hypertension: Secondary | ICD-10-CM | POA: Diagnosis not present

## 2014-09-07 DIAGNOSIS — H02839 Dermatochalasis of unspecified eye, unspecified eyelid: Secondary | ICD-10-CM | POA: Diagnosis not present

## 2014-09-10 DIAGNOSIS — H2513 Age-related nuclear cataract, bilateral: Secondary | ICD-10-CM | POA: Diagnosis not present

## 2014-09-10 DIAGNOSIS — H25812 Combined forms of age-related cataract, left eye: Secondary | ICD-10-CM | POA: Diagnosis not present

## 2014-09-10 DIAGNOSIS — H2512 Age-related nuclear cataract, left eye: Secondary | ICD-10-CM | POA: Diagnosis not present

## 2014-10-29 ENCOUNTER — Other Ambulatory Visit: Payer: Self-pay | Admitting: Family Medicine

## 2014-11-01 ENCOUNTER — Other Ambulatory Visit: Payer: Self-pay | Admitting: Family Medicine

## 2014-11-01 NOTE — Telephone Encounter (Signed)
Rx refused have been filled.//AB/CMA

## 2014-11-04 ENCOUNTER — Other Ambulatory Visit: Payer: Self-pay | Admitting: Family Medicine

## 2014-11-04 DIAGNOSIS — H35373 Puckering of macula, bilateral: Secondary | ICD-10-CM | POA: Diagnosis not present

## 2014-11-04 DIAGNOSIS — H35372 Puckering of macula, left eye: Secondary | ICD-10-CM | POA: Diagnosis not present

## 2015-02-04 DIAGNOSIS — M23206 Derangement of unspecified meniscus due to old tear or injury, right knee: Secondary | ICD-10-CM | POA: Diagnosis not present

## 2015-02-21 DIAGNOSIS — M25561 Pain in right knee: Secondary | ICD-10-CM | POA: Diagnosis not present

## 2015-02-24 DIAGNOSIS — M1711 Unilateral primary osteoarthritis, right knee: Secondary | ICD-10-CM | POA: Diagnosis not present

## 2015-03-10 ENCOUNTER — Encounter: Payer: Self-pay | Admitting: Gastroenterology

## 2015-05-01 ENCOUNTER — Other Ambulatory Visit: Payer: Self-pay | Admitting: Family Medicine

## 2015-05-01 NOTE — Telephone Encounter (Signed)
Cannot continue to refill meds have not seen him since 2014. Can have 15 tabs of the Amlodipine to allow him to get in for an appt. With someone

## 2015-09-15 DIAGNOSIS — Z08 Encounter for follow-up examination after completed treatment for malignant neoplasm: Secondary | ICD-10-CM | POA: Diagnosis not present

## 2015-09-15 DIAGNOSIS — L570 Actinic keratosis: Secondary | ICD-10-CM | POA: Diagnosis not present

## 2015-09-15 DIAGNOSIS — Z85828 Personal history of other malignant neoplasm of skin: Secondary | ICD-10-CM | POA: Diagnosis not present

## 2015-09-15 DIAGNOSIS — L219 Seborrheic dermatitis, unspecified: Secondary | ICD-10-CM | POA: Diagnosis not present

## 2015-09-27 ENCOUNTER — Telehealth: Payer: Self-pay | Admitting: Behavioral Health

## 2015-09-27 ENCOUNTER — Ambulatory Visit: Payer: Medicare Other

## 2015-09-27 NOTE — Telephone Encounter (Signed)
Appointment confirmed for tomorrow's Medicare Wellness Visit with Mariane Duval, RN, 09/28/15 at 9:30 AM.

## 2015-09-28 ENCOUNTER — Ambulatory Visit (INDEPENDENT_AMBULATORY_CARE_PROVIDER_SITE_OTHER): Payer: Medicare Other

## 2015-09-28 VITALS — BP 132/70 | HR 78 | Ht 67.0 in | Wt 214.0 lb

## 2015-09-28 DIAGNOSIS — Z0000 Encounter for general adult medical examination without abnormal findings: Secondary | ICD-10-CM | POA: Diagnosis not present

## 2015-09-28 NOTE — Progress Notes (Signed)
Pre visit review using our clinic review tool, if applicable. No additional management support is needed unless otherwise documented below in the visit note. 

## 2015-09-28 NOTE — Patient Instructions (Addendum)
ring in a copy of Advanced Directive at next visit.  Receive Pneumonia 13 vaccine at next visit.  Look into purchasing shoes that could help make walking more comfortable.  Eat heart healthy diet (full of fruits, vegetables, whole grains, lean protein, water--limit salt, fat, and sugar intake) and increase physical activity as tolerated.  Continue doing brain stimulating activities (puzzles, reading, adult coloring books, staying active) to keep memory sharp.   Follow up with [name] as scheduled for follow up and labs.     Fat and Cholesterol Restricted Diet High levels of fat and cholesterol in your blood may lead to various health problems, such as diseases of the heart, blood vessels, gallbladder, liver, and pancreas. Fats are concentrated sources of energy that come in various forms. Certain types of fat, including saturated fat, may be harmful in excess. Cholesterol is a substance needed by your body in small amounts. Your body makes all the cholesterol it needs. Excess cholesterol comes from the food you eat. When you have high levels of cholesterol and saturated fat in your blood, health problems can develop because the excess fat and cholesterol will gather along the walls of your blood vessels, causing them to narrow. Choosing the right foods will help you control your intake of fat and cholesterol. This will help keep the levels of these substances in your blood within normal limits and reduce your risk of disease. WHAT IS MY PLAN? Your health care provider recommends that you:  Get no more than __________ % of the total calories in your daily diet from fat.  Limit your intake of saturated fat to less than ______% of your total calories each day.  Limit the amount of cholesterol in your diet to less than _________mg per day. WHAT TYPES OF FAT SHOULD I CHOOSE?  Choose healthy fats more often. Choose monounsaturated and polyunsaturated fats, such as olive and canola oil,  flaxseeds, walnuts, almonds, and seeds.  Eat more omega-3 fats. Good choices include salmon, mackerel, sardines, tuna, flaxseed oil, and ground flaxseeds. Aim to eat fish at least two times a week.  Limit saturated fats. Saturated fats are primarily found in animal products, such as meats, butter, and cream. Plant sources of saturated fats include palm oil, palm kernel oil, and coconut oil.  Avoid foods with partially hydrogenated oils in them. These contain trans fats. Examples of foods that contain trans fats are stick margarine, some tub margarines, cookies, crackers, and other baked goods. WHAT GENERAL GUIDELINES DO I NEED TO FOLLOW? These guidelines for healthy eating will help you control your intake of fat and cholesterol:  Check food labels carefully to identify foods with trans fats or high amounts of saturated fat.  Fill one half of your plate with vegetables and green salads.  Fill one fourth of your plate with whole grains. Look for the word "whole" as the first word in the ingredient list.  Fill one fourth of your plate with lean protein foods.  Limit fruit to two servings a day. Choose fruit instead of juice.  Eat more foods that contain soluble fiber. Examples of foods that contain this type of fiber are apples, broccoli, carrots, beans, peas, and barley. Aim to get 20-30 g of fiber per day.  Eat more home-cooked food and less restaurant, buffet, and fast food.  Limit or avoid alcohol.  Limit foods high in starch and sugar.  Limit fried foods.  Cook foods using methods other than frying. Baking, boiling, grilling, and broiling are  all great options.  Lose weight if you are overweight. Losing just 5-10% of your initial body weight can help your overall health and prevent diseases such as diabetes and heart disease. WHAT FOODS CAN I EAT? Grains Whole grains, such as whole wheat or whole grain breads, crackers, cereals, and pasta. Unsweetened oatmeal, bulgur, barley,  quinoa, or brown rice. Corn or whole wheat flour tortillas. Vegetables Fresh or frozen vegetables (raw, steamed, roasted, or grilled). Green salads. Fruits All fresh, canned (in natural juice), or frozen fruits. Meat and Other Protein Products Ground beef (85% or leaner), grass-fed beef, or beef trimmed of fat. Skinless chicken or [place]. Ground chicken or [place]. Pork trimmed of fat. All fish and seafood. Eggs. Dried beans, peas, or lentils. Unsalted nuts or seeds. Unsalted canned or dry beans. Dairy Low-fat dairy products, such as skim or 1% milk, 2% or reduced-fat cheeses, low-fat ricotta or cottage cheese, or plain low-fat yogurt. Fats and Oils Tub margarines without trans fats. Light or reduced-fat mayonnaise and salad dressings. Avocado. Olive, canola, sesame, or safflower oils. Natural peanut or almond butter (choose ones without added sugar and oil). The items listed above may not be a complete list of recommended foods or beverages. Contact your dietitian for more options. WHAT FOODS ARE NOT RECOMMENDED? Grains White bread. White pasta. White rice. Cornbread. Bagels, pastries, and croissants. Crackers that contain trans fat. Vegetables White potatoes. Corn. Creamed or fried vegetables. Vegetables in a cheese sauce. Fruits Dried fruits. Canned fruit in light or heavy syrup. Fruit juice. Meat and Other Protein Products Fatty cuts of meat. Ribs, chicken wings, bacon, sausage, bologna, salami, chitterlings, fatback, hot dogs, bratwurst, and packaged luncheon meats. Liver and organ meats. Dairy Whole or 2% milk, cream, half-and-half, and cream cheese. Whole milk cheeses. Whole-fat or sweetened yogurt. Full-fat cheeses. Nondairy creamers and whipped toppings. Processed cheese, cheese spreads, or cheese curds. Sweets and Desserts Corn syrup, sugars, honey, and molasses. Candy. Jam and jelly. Syrup. Sweetened cereals. Cookies, pies, cakes, donuts, muffins, and ice cream. Fats and  Oils Butter, stick margarine, lard, shortening, ghee, or bacon fat. Coconut, palm kernel, or palm oils. Beverages Alcohol. Sweetened drinks (such as sodas, lemonade, and fruit drinks or punches). The items listed above may not be a complete list of foods and beverages to avoid. Contact your dietitian for more information.   This information is not intended to replace advice given to you by your health care provider. Make sure you discuss any questions you have with your health care provider.   Document Released: 09/17/2005 Document Revised: 10/08/2014 Document Reviewed: 12/16/2013 Elsevier Interactive Patient Education 2016 [place] Can Quit Smoking If you are ready to quit smoking or are thinking about it, congratulations! You have chosen to help yourself be healthier and live longer! There are lots of different ways to quit smoking. Nicotine gum, nicotine patches, a nicotine inhaler, or nicotine nasal spray can help with physical craving. Hypnosis, support groups, and medicines help break the habit of smoking. TIPS TO GET OFF AND STAY OFF CIGARETTES  Learn to predict your moods. Do not let a bad situation be your excuse to have a cigarette. Some situations in your life might tempt you to have a cigarette.  Ask friends and co-workers not to smoke around you.  Make your home smoke-free.  Never have "just one" cigarette. It leads to wanting another and another. Remind yourself of your decision to quit.  On a card, make a list of your reasons for not  smoking. Read it at least the same number of times a day as you have a cigarette. Tell yourself everyday, "I do not want to smoke. I choose not to smoke."  Ask someone at home or work to help you with your plan to quit smoking.  Have something planned after you eat or have a cup of coffee. Take a walk or get other exercise to perk you up. This will help to keep you from overeating.  Try a relaxation exercise to calm you down and  decrease your stress. Remember, you may be tense and nervous the first two weeks after you quit. This will pass.  Find new activities to keep your hands busy. Play with a pen, coin, or rubber band. Doodle or draw things on paper.  Brush your teeth right after eating. This will help cut down the craving for the taste of tobacco after meals. You can try mouthwash too.  Try gum, breath mints, or diet candy to keep something in your mouth. IF YOU SMOKE AND WANT TO QUIT:  Do not stock up on cigarettes. Never buy a carton. Wait until one pack is finished before you buy another.  Never carry cigarettes with you at work or at home.  Keep cigarettes as far away from you as possible. Leave them with someone else.  Never carry matches or a lighter with you.  Ask yourself, "Do I need this cigarette or is this just a reflex?"  Bet with someone that you can quit. Put cigarette money in a piggy bank every morning. If you smoke, you give up the money. If you do not smoke, by the end of the week, you keep the money.  Keep trying. It takes 21 days to change a habit!  Talk to your doctor about using medicines to help you quit. These include nicotine replacement gum, lozenges, or skin patches.   This information is not intended to replace advice given to you by your health care provider. Make sure you discuss any questions you have with your health care provider.   Document Released: 07/14/2009 Document Revised: 12/10/2011 Document Reviewed: 07/14/2009 Elsevier Interactive Patient Education [employer].
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
## 2015-09-28 NOTE — Progress Notes (Signed)
   Subjective:    Patient ID: Edward Hernandez, male    DOB: [birth year], 74 y.o.   MRN: [id number]  HPI Agree w/ documentation as noted by RN   Review of Systems To be done at time of CPE    Objective:   Physical Exam To be done at time of CPE       Assessment & Plan:  F/U w/ PCP for CPE

## 2015-10-17 ENCOUNTER — Ambulatory Visit (INDEPENDENT_AMBULATORY_CARE_PROVIDER_SITE_OTHER): Payer: Medicare Other | Admitting: Medical

## 2015-10-17 ENCOUNTER — Encounter: Payer: Self-pay | Admitting: Medical

## 2015-10-17 VITALS — BP 130/88 | HR 75 | Temp 97.8°F | Ht 66.0 in | Wt 213.8 lb

## 2015-10-17 DIAGNOSIS — J209 Acute bronchitis, unspecified: Secondary | ICD-10-CM | POA: Diagnosis not present

## 2015-10-17 DIAGNOSIS — R05 Cough: Secondary | ICD-10-CM | POA: Diagnosis not present

## 2015-10-17 DIAGNOSIS — R059 Cough, unspecified: Secondary | ICD-10-CM

## 2015-10-17 MED ORDER — AZITHROMYCIN 250 MG PO TABS: ORAL_TABLET | ORAL | Status: DC

## 2015-10-17 MED ORDER — BENZONATATE 200 MG PO CAPS: 200.0000 mg | ORAL_CAPSULE | Freq: Three times a day (TID) | ORAL | Status: DC | PRN

## 2015-10-17 MED ORDER — FLUTICASONE PROPIONATE 50 MCG/ACT NA SUSP: 2.0000 | Freq: Every day | NASAL | Status: DC

## 2015-10-17 NOTE — Progress Notes (Signed)
Pre visit review using our clinic review tool, if applicable. No additional management support is needed unless otherwise documented below in the visit note. 

## 2015-10-17 NOTE — Patient Instructions (Addendum)
You appear to have bronchitis. Rest hydrate and tylenol for fever. I am prescribing benzonatate cough medicine, and azithromycin  antibiotic. For your nasal congestion you could use flonase  You should gradually get better. If not then notify us and would recommend a chest xray.  Follow up in 7-10 days or as needed
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
## 2015-11-22 ENCOUNTER — Ambulatory Visit: Payer: Medicare Other | Admitting: Family Medicine

## 2015-12-02 ENCOUNTER — Ambulatory Visit (INDEPENDENT_AMBULATORY_CARE_PROVIDER_SITE_OTHER): Payer: Medicare Other | Admitting: Family Medicine

## 2015-12-02 ENCOUNTER — Encounter: Payer: Self-pay | Admitting: Family Medicine

## 2015-12-02 VITALS — BP 162/92 | HR 63 | Temp 97.7°F | Ht 68.0 in | Wt 213.0 lb

## 2015-12-02 DIAGNOSIS — R7309 Other abnormal glucose: Secondary | ICD-10-CM | POA: Diagnosis not present

## 2015-12-02 DIAGNOSIS — E782 Mixed hyperlipidemia: Secondary | ICD-10-CM

## 2015-12-02 DIAGNOSIS — I10 Essential (primary) hypertension: Secondary | ICD-10-CM | POA: Diagnosis not present

## 2015-12-02 DIAGNOSIS — M1990 Unspecified osteoarthritis, unspecified site: Secondary | ICD-10-CM

## 2015-12-02 DIAGNOSIS — N259 Disorder resulting from impaired renal tubular function, unspecified: Secondary | ICD-10-CM

## 2015-12-02 DIAGNOSIS — Z0000 Encounter for general adult medical examination without abnormal findings: Secondary | ICD-10-CM

## 2015-12-02 DIAGNOSIS — Z23 Encounter for immunization: Secondary | ICD-10-CM | POA: Diagnosis not present

## 2015-12-02 LAB — COMPREHENSIVE METABOLIC PANEL
ALT: 62 U/L — ABNORMAL HIGH (ref 0–53)
AST: 44 U/L — ABNORMAL HIGH (ref 0–37)
Albumin: 4.7 g/dL (ref 3.5–5.2)
Alkaline Phosphatase: 55 U/L (ref 39–117)
BUN: 27 mg/dL — ABNORMAL HIGH (ref 6–23)
CO2: 28 meq/L (ref 19–32)
Calcium: 10.4 mg/dL (ref 8.4–10.5)
Chloride: 108 mEq/L (ref 96–112)
Creatinine, Ser: 1.63 mg/dL — ABNORMAL HIGH (ref 0.40–1.50)
GFR: 44.23 mL/min — AB (ref >60.00)
GLUCOSE: 127 mg/dL — AB (ref 70–99)
POTASSIUM: 4.6 meq/L (ref 3.5–5.1)
Sodium: 143 mEq/L (ref 135–145)
Total Bilirubin: 1 mg/dL (ref 0.2–1.2)
Total Protein: 7.7 g/dL (ref 6.0–8.3)

## 2015-12-02 LAB — CBC
HCT: 46.5 % (ref 39.0–52.0)
HEMOGLOBIN: 15.6 g/dL (ref 13.0–17.0)
MCHC: 33.5 g/dL (ref 30.0–36.0)
MCV: 92.3 fl (ref 78.0–100.0)
PLATELETS: 204 10*3/uL (ref 150.0–400.0)
RBC: 5.04 Mil/uL (ref 4.22–5.81)
RDW: 14.4 % (ref 11.5–15.5)
WBC: 7.7 10*3/uL (ref 4.0–10.5)

## 2015-12-02 LAB — LIPID PANEL
CHOL/HDL RATIO: 4
Cholesterol: 200 mg/dL (ref 0–200)
HDL: 48.5 mg/dL (ref >39.00)
NONHDL: 151.96
TRIGLYCERIDES: 212 mg/dL — AB (ref 0.0–149.0)
VLDL: 42.4 mg/dL — ABNORMAL HIGH (ref 0.0–40.0)

## 2015-12-02 LAB — LDL CHOLESTEROL, DIRECT: Direct LDL: 128 mg/dL

## 2015-12-02 LAB — HEMOGLOBIN A1C: HEMOGLOBIN A1C: 6.3 % (ref 4.6–6.5)

## 2015-12-02 LAB — MICROALBUMIN / CREATININE URINE RATIO
Creatinine,U: 126.1 mg/dL
MICROALB/CREAT RATIO: 2.2 mg/g (ref 0.0–30.0)
Microalb, Ur: 2.8 mg/dL — ABNORMAL HIGH (ref 0.0–1.9)

## 2015-12-02 LAB — TSH: TSH: 0.97 u[IU]/mL (ref 0.35–4.50)

## 2015-12-02 NOTE — Progress Notes (Signed)
Pre visit review using our clinic review tool, if applicable. No additional management support is needed unless otherwise documented below in the visit note. 

## 2015-12-02 NOTE — Patient Instructions (Signed)

## 2015-12-05 ENCOUNTER — Other Ambulatory Visit: Payer: Self-pay | Admitting: Family Medicine

## 2015-12-05 DIAGNOSIS — R945 Abnormal results of liver function studies: Secondary | ICD-10-CM

## 2015-12-11 ENCOUNTER — Encounter: Payer: Self-pay | Admitting: Family Medicine

## 2015-12-11 DIAGNOSIS — Z0000 Encounter for general adult medical examination without abnormal findings: Secondary | ICD-10-CM

## 2015-12-11 HISTORY — DX: Encounter for general adult medical examination without abnormal findings: Z00.00

## 2015-12-11 NOTE — Assessment & Plan Note (Signed)
Poorly controlled but did not take all of meds today. Encouraged heart healthy diet such as the DASH diet and exercise as tolerated.

## 2015-12-11 NOTE — Assessment & Plan Note (Signed)
hgba1c acceptable, minimize simple carbs. Increase exercise as tolerated.  
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
## 2015-12-11 NOTE — Assessment & Plan Note (Signed)
Encouraged heart healthy diet, increase exercise, avoid trans fats, consider a krill oil cap daily 

## 2015-12-11 NOTE — Assessment & Plan Note (Signed)
Maintain hydration. Continue to monitor

## 2015-12-12 ENCOUNTER — Other Ambulatory Visit: Payer: Self-pay | Admitting: Family Medicine

## 2015-12-13 ENCOUNTER — Other Ambulatory Visit: Payer: Self-pay | Admitting: Family Medicine

## 2015-12-18 ENCOUNTER — Other Ambulatory Visit: Payer: Self-pay | Admitting: Family Medicine

## 2015-12-20 ENCOUNTER — Other Ambulatory Visit: Payer: Self-pay | Admitting: Family Medicine

## 2016-01-31 ENCOUNTER — Other Ambulatory Visit: Payer: Medicare Other

## 2016-02-07 ENCOUNTER — Other Ambulatory Visit (INDEPENDENT_AMBULATORY_CARE_PROVIDER_SITE_OTHER): Payer: Medicare Other

## 2016-02-07 DIAGNOSIS — K7689 Other specified diseases of liver: Secondary | ICD-10-CM

## 2016-02-07 DIAGNOSIS — R945 Abnormal results of liver function studies: Secondary | ICD-10-CM

## 2016-02-07 LAB — COMPREHENSIVE METABOLIC PANEL
ALBUMIN: 4.3 g/dL (ref 3.5–5.2)
ALK PHOS: 54 U/L (ref 39–117)
ALT: 38 U/L (ref 0–53)
AST: 32 U/L (ref 0–37)
BUN: 33 mg/dL — AB (ref 6–23)
CALCIUM: 10 mg/dL (ref 8.4–10.5)
CHLORIDE: 105 meq/L (ref 96–112)
CO2: 25 mEq/L (ref 19–32)
CREATININE: 1.7 mg/dL — AB (ref 0.40–1.50)
GFR: 42.11 mL/min — ABNORMAL LOW (ref >60.00)
Glucose, Bld: 123 mg/dL — ABNORMAL HIGH (ref 70–99)
POTASSIUM: 4.6 meq/L (ref 3.5–5.1)
SODIUM: 140 meq/L (ref 135–145)
TOTAL PROTEIN: 7.2 g/dL (ref 6.0–8.3)
Total Bilirubin: 1 mg/dL (ref 0.2–1.2)

## 2016-06-14 ENCOUNTER — Encounter: Payer: Medicare Other | Admitting: Family Medicine

## 2016-07-10 ENCOUNTER — Telehealth: Payer: Self-pay | Admitting: Family Medicine

## 2016-07-10 MED ORDER — AMLODIPINE BESYLATE 2.5 MG PO TABS: 2.5000 mg | ORAL_TABLET | Freq: Every day | ORAL | 0 refills | Status: DC

## 2016-07-10 MED ORDER — ENALAPRIL MALEATE 10 MG PO TABS: 10.0000 mg | ORAL_TABLET | Freq: Every day | ORAL | 0 refills | Status: DC

## 2016-07-10 NOTE — Telephone Encounter (Signed)
Relation to [id number] Call back number:[phone] Pharmacy: [id number] - [place], [place] [phone] (Phone) [phone] (Fax)      Reason for call:  Patient requesting a refill amLODipine (NORVASC) 2.5 MG tablet and enalapril (VASOTEC) 10 MG tablet

## 2016-08-07 ENCOUNTER — Ambulatory Visit: Payer: Self-pay | Admitting: Family Medicine

## 2016-08-15 DIAGNOSIS — Z85828 Personal history of other malignant neoplasm of skin: Secondary | ICD-10-CM | POA: Diagnosis not present

## 2016-08-15 DIAGNOSIS — D485 Neoplasm of uncertain behavior of skin: Secondary | ICD-10-CM | POA: Diagnosis not present

## 2016-08-15 DIAGNOSIS — L570 Actinic keratosis: Secondary | ICD-10-CM | POA: Diagnosis not present

## 2016-08-15 DIAGNOSIS — Z08 Encounter for follow-up examination after completed treatment for malignant neoplasm: Secondary | ICD-10-CM | POA: Diagnosis not present

## 2016-08-21 DIAGNOSIS — L570 Actinic keratosis: Secondary | ICD-10-CM | POA: Diagnosis not present

## 2016-09-05 ENCOUNTER — Encounter: Payer: Self-pay | Admitting: Family Medicine

## 2016-09-05 LAB — COLOGUARD: COLOGUARD: NEGATIVE

## 2016-10-01 HISTORY — PX: OTHER SURGICAL HISTORY: SHX169

## 2016-10-02 ENCOUNTER — Encounter: Payer: Medicare Other | Admitting: Family Medicine

## 2016-10-18 ENCOUNTER — Other Ambulatory Visit: Payer: Self-pay | Admitting: Family Medicine

## 2016-10-23 ENCOUNTER — Encounter: Payer: Self-pay | Admitting: Family Medicine

## 2016-10-30 ENCOUNTER — Encounter: Payer: Medicare Other | Admitting: Family Medicine

## 2016-11-29 ENCOUNTER — Telehealth: Payer: Self-pay | Admitting: *Deleted

## 2016-11-29 NOTE — Telephone Encounter (Signed)
Pt declined AWV w/ [facility] stating he had it done at home in December 2017 through [facility].

## 2016-11-30 ENCOUNTER — Ambulatory Visit (INDEPENDENT_AMBULATORY_CARE_PROVIDER_SITE_OTHER): Payer: Medicare Other | Admitting: Family Medicine

## 2016-11-30 ENCOUNTER — Encounter: Payer: Self-pay | Admitting: Family Medicine

## 2016-11-30 VITALS — BP 132/82 | HR 74 | Temp 98.1°F | Resp 20 | Wt 213.2 lb

## 2016-11-30 DIAGNOSIS — R351 Nocturia: Secondary | ICD-10-CM

## 2016-11-30 DIAGNOSIS — E782 Mixed hyperlipidemia: Secondary | ICD-10-CM

## 2016-11-30 DIAGNOSIS — Z0000 Encounter for general adult medical examination without abnormal findings: Secondary | ICD-10-CM | POA: Diagnosis not present

## 2016-11-30 DIAGNOSIS — I10 Essential (primary) hypertension: Secondary | ICD-10-CM | POA: Diagnosis not present

## 2016-11-30 DIAGNOSIS — R7309 Other abnormal glucose: Secondary | ICD-10-CM

## 2016-11-30 DIAGNOSIS — R739 Hyperglycemia, unspecified: Secondary | ICD-10-CM | POA: Diagnosis not present

## 2016-11-30 LAB — URINALYSIS
Bilirubin Urine: NEGATIVE
Glucose, UA: NEGATIVE
Hgb urine dipstick: NEGATIVE
Ketones, ur: NEGATIVE
LEUKOCYTES UA: NEGATIVE
NITRITE: NEGATIVE
PH: 5 (ref 5.0–8.0)
Protein, ur: NEGATIVE
SPECIFIC GRAVITY, URINE: 1.017 (ref 1.001–1.035)

## 2016-11-30 LAB — LIPID PANEL
Cholesterol: 188 mg/dL (ref <200)
HDL: 42 mg/dL (ref >40)
LDL Cholesterol: 102 mg/dL — ABNORMAL HIGH (ref <100)
TRIGLYCERIDES: 220 mg/dL — AB (ref <150)
Total CHOL/HDL Ratio: 4.5 Ratio (ref <5.0)
VLDL: 44 mg/dL — ABNORMAL HIGH (ref <30)

## 2016-11-30 LAB — COMPREHENSIVE METABOLIC PANEL
ALK PHOS: 48 U/L (ref 40–115)
ALT: 56 U/L — AB (ref 9–46)
AST: 41 U/L — ABNORMAL HIGH (ref 10–35)
Albumin: 4.6 g/dL (ref 3.6–5.1)
BUN: 36 mg/dL — ABNORMAL HIGH (ref 7–25)
CHLORIDE: 110 mmol/L (ref 98–110)
CO2: 22 mmol/L (ref 20–31)
Calcium: 10.5 mg/dL — ABNORMAL HIGH (ref 8.6–10.3)
Creat: 1.74 mg/dL — ABNORMAL HIGH (ref 0.70–1.18)
GLUCOSE: 104 mg/dL — AB (ref 65–99)
POTASSIUM: 4.8 mmol/L (ref 3.5–5.3)
Sodium: 145 mmol/L (ref 135–146)
TOTAL PROTEIN: 7.3 g/dL (ref 6.1–8.1)
Total Bilirubin: 0.7 mg/dL (ref 0.2–1.2)

## 2016-11-30 LAB — CBC
HCT: 45.7 % (ref 38.5–50.0)
Hemoglobin: 15.3 g/dL (ref 13.2–17.1)
MCH: 31.3 pg (ref 27.0–33.0)
MCHC: 33.5 g/dL (ref 32.0–36.0)
MCV: 93.5 fL (ref 80.0–100.0)
MPV: 11 fL (ref 7.5–12.5)
Platelets: 183 10*3/uL (ref 140–400)
RBC: 4.89 MIL/uL (ref 4.20–5.80)
RDW: 13.9 % (ref 11.0–15.0)
WBC: 8.6 10*3/uL (ref 3.8–10.8)

## 2016-11-30 LAB — PSA: PSA: 1.6 ng/mL (ref <=4.0)

## 2016-11-30 MED ORDER — ENALAPRIL MALEATE 10 MG PO TABS: 10.0000 mg | ORAL_TABLET | Freq: Every day | ORAL | 0 refills | Status: DC

## 2016-11-30 MED ORDER — SILDENAFIL CITRATE 20 MG PO TABS: 20.0000 mg | ORAL_TABLET | Freq: Every day | ORAL | 1 refills | Status: DC | PRN

## 2016-11-30 MED ORDER — AMLODIPINE BESYLATE 2.5 MG PO TABS: 2.5000 mg | ORAL_TABLET | Freq: Every day | ORAL | 0 refills | Status: DC

## 2016-11-30 NOTE — Patient Instructions (Addendum)
[facility] Leg cramp medicine for cramps     Diabetes and Foot Care Diabetes may cause you to have problems because of poor blood supply (circulation) to your feet and legs. This may cause the skin on your feet to become thinner, break easier, and heal more slowly. Your skin may become dry, and the skin may peel and crack. You may also have nerve damage in your legs and feet causing decreased feeling in them. You may not notice minor injuries to your feet that could lead to infections or more serious problems. Taking care of your feet is one of the most important things you can do for yourself. Follow these instructions at home:  Wear shoes at all times, even in the house. Do not go barefoot. Bare feet are easily injured.  Check your feet daily for blisters, cuts, and redness. If you cannot see the bottom of your feet, use a mirror or ask someone for help.  Wash your feet with warm water (do not use hot water) and mild soap. Then pat your feet and the areas between your toes until they are completely dry. Do not soak your feet as this can dry your skin.  Apply a moisturizing lotion or petroleum jelly (that does not contain alcohol and is unscented) to the skin on your feet and to dry, brittle toenails. Do not apply lotion between your toes.  Trim your toenails straight across. Do not dig under them or around the cuticle. File the edges of your nails with an emery board or nail file.  Do not cut corns or calluses or try to remove them with medicine.  Wear clean socks or stockings every day. Make sure they are not too tight. Do not wear knee-high stockings since they may decrease blood flow to your legs.  Wear shoes that fit properly and have enough cushioning. To break in new shoes, wear them for just a few hours a day. This prevents you from injuring your feet. Always look in your shoes before you put them on to be sure there are no objects inside.  Do not cross your legs. This may decrease the  blood flow to your feet.  If you find a minor scrape, cut, or break in the skin on your feet, keep it and the skin around it clean and dry. These areas may be cleansed with mild soap and water. Do not cleanse the area with peroxide, alcohol, or iodine.  When you remove an adhesive bandage, be sure not to damage the skin around it.  If you have a wound, look at it several times a day to make sure it is healing.  Do not use heating pads or hot water bottles. They may burn your skin. If you have lost feeling in your feet or legs, you may not know it is happening until it is too late.  Make sure your health care provider performs a complete foot exam at least annually or more often if you have foot problems. Report any cuts, sores, or bruises to your health care provider immediately. Contact a health care provider if:  You have an injury that is not healing.  You have cuts or breaks in the skin.  You have an ingrown nail.  You notice redness on your legs or feet.  You feel burning or tingling in your legs or feet.  You have pain or cramps in your legs and feet.  Your legs or feet are numb.  Your feet always feel  cold. Get help right away if:  There is increasing redness, swelling, or pain in or around a wound.  There is a red line that goes up your leg.  Pus is coming from a wound.  You develop a fever or as directed by your health care provider.  You notice a bad smell coming from an ulcer or wound. This information is not intended to replace advice given to you by your health care provider. Make sure you discuss any questions you have with your health care provider. Document Released: 09/14/2000 Document Revised: 02/23/2016 Document Reviewed: 02/24/2013 Elsevier Interactive Patient Education  2017 [employer].

## 2016-11-30 NOTE — Assessment & Plan Note (Signed)
Has increased from 2 x nightly t 3 x nightly will check UA and PSA

## 2016-11-30 NOTE — Assessment & Plan Note (Signed)
Well controlled, no changes to meds. Encouraged heart healthy diet such as the DASH diet and exercise as tolerated.  °

## 2016-11-30 NOTE — Assessment & Plan Note (Signed)
Patient encouraged to maintain heart healthy diet, regular exercise, adequate sleep. Consider daily probiotics. Take medications as prescribed. He has a HCP and LW agrees to provide [place] with a copy. Is interested in getting the new Shingles vaccine but will wait til he returns

## 2016-11-30 NOTE — Progress Notes (Signed)
Pre visit review using our clinic review tool, if applicable. No additional management support is needed unless otherwise documented below in the visit note. 

## 2016-11-30 NOTE — Assessment & Plan Note (Signed)
Encouraged heart healthy diet, increase exercise, avoid trans fats, consider a krill oil cap daily 
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
## 2016-11-30 NOTE — Assessment & Plan Note (Signed)
hgba1c acceptable, minimize simple carbs. Increase exercise as tolerated.  

## 2016-12-01 LAB — HEMOGLOBIN A1C
Hgb A1c MFr Bld: 6.2 % — ABNORMAL HIGH (ref <5.7)
Mean Plasma Glucose: 131 mg/dL

## 2016-12-03 ENCOUNTER — Other Ambulatory Visit: Payer: Self-pay | Admitting: Family Medicine

## 2016-12-03 DIAGNOSIS — E785 Hyperlipidemia, unspecified: Secondary | ICD-10-CM

## 2016-12-03 DIAGNOSIS — R739 Hyperglycemia, unspecified: Secondary | ICD-10-CM

## 2016-12-03 DIAGNOSIS — E108 Type 1 diabetes mellitus with unspecified complications: Secondary | ICD-10-CM

## 2017-03-06 ENCOUNTER — Other Ambulatory Visit (INDEPENDENT_AMBULATORY_CARE_PROVIDER_SITE_OTHER): Payer: Medicare Other

## 2017-03-06 DIAGNOSIS — R739 Hyperglycemia, unspecified: Secondary | ICD-10-CM

## 2017-03-06 DIAGNOSIS — E785 Hyperlipidemia, unspecified: Secondary | ICD-10-CM | POA: Diagnosis not present

## 2017-03-06 LAB — CBC
HCT: 41.3 % (ref 39.0–52.0)
Hemoglobin: 13.9 g/dL (ref 13.0–17.0)
MCHC: 33.6 g/dL (ref 30.0–36.0)
MCV: 94.2 fl (ref 78.0–100.0)
Platelets: 167 10*3/uL (ref 150.0–400.0)
RBC: 4.38 Mil/uL (ref 4.22–5.81)
RDW: 14 % (ref 11.5–15.5)
WBC: 7.2 10*3/uL (ref 4.0–10.5)

## 2017-03-06 LAB — COMPREHENSIVE METABOLIC PANEL
ALBUMIN: 4.3 g/dL (ref 3.5–5.2)
ALK PHOS: 49 U/L (ref 39–117)
ALT: 37 U/L (ref 0–53)
AST: 29 U/L (ref 0–37)
BILIRUBIN TOTAL: 0.8 mg/dL (ref 0.2–1.2)
BUN: 30 mg/dL — ABNORMAL HIGH (ref 6–23)
CALCIUM: 9.9 mg/dL (ref 8.4–10.5)
CO2: 25 mEq/L (ref 19–32)
CREATININE: 1.72 mg/dL — AB (ref 0.40–1.50)
Chloride: 109 mEq/L (ref 96–112)
GFR: 41.43 mL/min — ABNORMAL LOW (ref >60.00)
Glucose, Bld: 108 mg/dL — ABNORMAL HIGH (ref 70–99)
Potassium: 4.5 mEq/L (ref 3.5–5.1)
Sodium: 142 mEq/L (ref 135–145)
TOTAL PROTEIN: 7 g/dL (ref 6.0–8.3)

## 2017-03-06 LAB — LIPID PANEL
CHOL/HDL RATIO: 4
Cholesterol: 170 mg/dL (ref 0–200)
HDL: 45.2 mg/dL (ref >39.00)
LDL CALC: 90 mg/dL (ref 0–99)
NONHDL: 124.7
Triglycerides: 174 mg/dL — ABNORMAL HIGH (ref 0.0–149.0)
VLDL: 34.8 mg/dL (ref 0.0–40.0)

## 2017-03-06 LAB — URINALYSIS
BILIRUBIN URINE: NEGATIVE
HGB URINE DIPSTICK: NEGATIVE
KETONES UR: NEGATIVE
LEUKOCYTES UA: NEGATIVE
NITRITE: NEGATIVE
Specific Gravity, Urine: 1.025 (ref 1.000–1.030)
Total Protein, Urine: NEGATIVE
Urine Glucose: NEGATIVE
Urobilinogen, UA: 0.2 (ref 0.0–1.0)
pH: 5.5 (ref 5.0–8.0)

## 2017-03-06 LAB — HEMOGLOBIN A1C: Hgb A1c MFr Bld: 6.3 % (ref 4.6–6.5)

## 2017-05-16 ENCOUNTER — Other Ambulatory Visit: Payer: Self-pay | Admitting: Family Medicine

## 2017-05-30 ENCOUNTER — Telehealth: Payer: Self-pay | Admitting: Family Medicine

## 2017-05-30 ENCOUNTER — Other Ambulatory Visit: Payer: Self-pay | Admitting: Family Medicine

## 2017-05-30 DIAGNOSIS — M79672 Pain in left foot: Principal | ICD-10-CM

## 2017-05-30 DIAGNOSIS — M79671 Pain in right foot: Secondary | ICD-10-CM

## 2017-05-30 NOTE — Telephone Encounter (Signed)
SB-Patient called stating that he is needing a referral for sciatic nerve and feet pain/plz advise/thx dmf

## 2017-05-30 NOTE — Telephone Encounter (Signed)
I LMOVM for pt to advise me on 2nd referral/Says that "Nerves Provider" referral is needed and I am wanting to know if he means for anxiety or actual nerves are bothering him? Will send message to provider for both Podiatry referral request as well as info I requested/thx dmf

## 2017-05-30 NOTE — Telephone Encounter (Signed)
Pt called in to request 2 referrals.    Pt says that he is having some feet pain and sciatic  nerve pain. Pt would like a referral to a podiatrist and also a nerves provider.

## 2017-05-30 NOTE — Telephone Encounter (Signed)
I have placed podiatry referral for the back/sciatic pain I can refer him to an orthopaedist or a sports medicine doctor. Does he have a preference?

## 2017-05-30 NOTE — Telephone Encounter (Signed)
Pt called back states that it is his sciatica nerve that is bothering him for over a month

## 2017-05-31 ENCOUNTER — Other Ambulatory Visit: Payer: Self-pay | Admitting: Family Medicine

## 2017-05-31 DIAGNOSIS — M5430 Sciatica, unspecified side: Secondary | ICD-10-CM

## 2017-05-31 DIAGNOSIS — M545 Low back pain: Secondary | ICD-10-CM

## 2017-05-31 NOTE — Telephone Encounter (Signed)
SB-No preference/thx dmf

## 2017-06-04 DIAGNOSIS — M5441 Lumbago with sciatica, right side: Secondary | ICD-10-CM | POA: Diagnosis not present

## 2017-06-04 DIAGNOSIS — M545 Low back pain: Secondary | ICD-10-CM | POA: Diagnosis not present
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
## 2017-06-10 DIAGNOSIS — M5416 Radiculopathy, lumbar region: Secondary | ICD-10-CM | POA: Diagnosis not present

## 2017-06-10 DIAGNOSIS — M545 Low back pain: Secondary | ICD-10-CM | POA: Diagnosis not present

## 2017-06-11 ENCOUNTER — Ambulatory Visit (INDEPENDENT_AMBULATORY_CARE_PROVIDER_SITE_OTHER): Payer: Medicare Other | Admitting: Family Medicine

## 2017-06-11 ENCOUNTER — Encounter: Payer: Self-pay | Admitting: Family Medicine

## 2017-06-11 VITALS — BP 142/92 | HR 65 | Ht 68.0 in | Wt 208.8 lb

## 2017-06-11 DIAGNOSIS — I10 Essential (primary) hypertension: Secondary | ICD-10-CM | POA: Diagnosis not present

## 2017-06-11 DIAGNOSIS — G629 Polyneuropathy, unspecified: Secondary | ICD-10-CM | POA: Insufficient documentation

## 2017-06-11 DIAGNOSIS — E6609 Other obesity due to excess calories: Secondary | ICD-10-CM

## 2017-06-11 DIAGNOSIS — E782 Mixed hyperlipidemia: Secondary | ICD-10-CM

## 2017-06-11 DIAGNOSIS — M1990 Unspecified osteoarthritis, unspecified site: Secondary | ICD-10-CM | POA: Diagnosis not present

## 2017-06-11 DIAGNOSIS — F17200 Nicotine dependence, unspecified, uncomplicated: Secondary | ICD-10-CM

## 2017-06-11 DIAGNOSIS — G63 Polyneuropathy in diseases classified elsewhere: Secondary | ICD-10-CM | POA: Diagnosis not present

## 2017-06-11 DIAGNOSIS — Z0000 Encounter for general adult medical examination without abnormal findings: Secondary | ICD-10-CM | POA: Diagnosis not present

## 2017-06-11 DIAGNOSIS — E669 Obesity, unspecified: Secondary | ICD-10-CM

## 2017-06-11 DIAGNOSIS — R7309 Other abnormal glucose: Secondary | ICD-10-CM

## 2017-06-11 HISTORY — DX: Obesity, unspecified: E66.9

## 2017-06-11 HISTORY — DX: Nicotine dependence, unspecified, uncomplicated: F17.200

## 2017-06-11 HISTORY — DX: Polyneuropathy, unspecified: G62.9

## 2017-06-11 LAB — COMPREHENSIVE METABOLIC PANEL
ALBUMIN: 4.4 g/dL (ref 3.5–5.2)
ALT: 56 U/L — AB (ref 0–53)
AST: 31 U/L (ref 0–37)
Alkaline Phosphatase: 44 U/L (ref 39–117)
BILIRUBIN TOTAL: 1 mg/dL (ref 0.2–1.2)
BUN: 43 mg/dL — ABNORMAL HIGH (ref 6–23)
CALCIUM: 10.6 mg/dL — AB (ref 8.4–10.5)
CO2: 25 mEq/L (ref 19–32)
CREATININE: 1.75 mg/dL — AB (ref 0.40–1.50)
Chloride: 107 mEq/L (ref 96–112)
GFR: 40.58 mL/min — AB (ref >60.00)
Glucose, Bld: 103 mg/dL — ABNORMAL HIGH (ref 70–99)
Potassium: 4.8 mEq/L (ref 3.5–5.1)
Sodium: 141 mEq/L (ref 135–145)
Total Protein: 7.2 g/dL (ref 6.0–8.3)

## 2017-06-11 LAB — LDL CHOLESTEROL, DIRECT: Direct LDL: 102 mg/dL

## 2017-06-11 LAB — CBC
HCT: 47.1 % (ref 39.0–52.0)
HEMOGLOBIN: 15.5 g/dL (ref 13.0–17.0)
MCHC: 33 g/dL (ref 30.0–36.0)
MCV: 96.9 fl (ref 78.0–100.0)
PLATELETS: 203 10*3/uL (ref 150.0–400.0)
RBC: 4.86 Mil/uL (ref 4.22–5.81)
RDW: 14.8 % (ref 11.5–15.5)
WBC: 10.4 10*3/uL (ref 4.0–10.5)

## 2017-06-11 LAB — HEMOGLOBIN A1C: Hgb A1c MFr Bld: 6.4 % (ref 4.6–6.5)

## 2017-06-11 LAB — LIPID PANEL
CHOL/HDL RATIO: 4
CHOLESTEROL: 190 mg/dL (ref 0–200)
HDL: 53.5 mg/dL (ref >39.00)
NonHDL: 136.98
TRIGLYCERIDES: 311 mg/dL — AB (ref 0.0–149.0)
VLDL: 62.2 mg/dL — ABNORMAL HIGH (ref 0.0–40.0)

## 2017-06-11 NOTE — Assessment & Plan Note (Signed)
hgba1c acceptable, minimize simple carbs. Increase exercise as tolerated.  

## 2017-06-11 NOTE — Assessment & Plan Note (Signed)
Likely multifactorial. Encouraged to decrease carbs, increase exercise, attempt weight loss, stop smoking and decrease alcohol intake. Also start Thiamine 100 mg po daily and Folic Acid 1 mg daily and report if worsens to the point of requiring meds.

## 2017-06-11 NOTE — Assessment & Plan Note (Signed)
Encouraged DASH diet, decrease po intake and increase exercise as tolerated. Needs 7-8 hours of sleep nightly. Avoid trans fats, eat small, frequent meals every 4-5 hours with lean proteins, complex carbs and healthy fats. Minimize simple carbs, given referral info for bariatric services.

## 2017-06-11 NOTE — Assessment & Plan Note (Addendum)
Low back pain with right sided sciatica recently. Prednisone taper helped some. Has just started PT, it flared the pain some but he will return next week. Will continue to follow with Dr Berenice Primas with [facility] and he got a steroid shot and taper. Add Lidocaine gel or patch

## 2017-06-11 NOTE — Assessment & Plan Note (Addendum)
Stopped his BP meds for several days and only restarted yesterday. Just is finishing a course of Prednisone taper. Improved some on recheck [facility] and if it is greater than 140/90 resting he will let us know so we can adjust meds.

## 2017-06-11 NOTE — Assessment & Plan Note (Signed)
Smokes a cigar a day. Encouraged complete cessation. Discussed need to quit as relates to risk of numerous cancers, cardiac and pulmonary disease as well as neurologic complications. Counseled for greater than 3 minutes

## 2017-06-11 NOTE — Patient Instructions (Addendum)
Edward Hernandez , Thank you for taking time to come for your Medicare Wellness Visit. I appreciate your ongoing commitment to your health goals. Please review the following plan we discussed and let me know if I can assist you in the future.   These are the goals we discussed: Goals      Patient Stated   . Increase physical activity. (pt-stated)          Increase walking.      . Lose 30 lbs by next year.   (pt-stated)          Watch portion sizes. Eat more vegetables than starches and protein.  Increase physical activity.         This is a list of the screening recommended for you and due dates:  Health Maintenance  Topic Date Due  . Complete foot exam   10/28/2009  . Eye exam for diabetics  12/26/2013  . Flu Shot  12/30/2018*  . Hemoglobin A1C  09/05/2017  . Cologuard (Stool DNA test)  09/06/2019  . Tetanus Vaccine  12/01/2025  . Pneumonia vaccines  Completed  *Topic was postponed. The date shown is not the original due date.   Continue to eat heart healthy diet (full of fruits, vegetables, whole grains, lean protein, water--limit salt, fat, and sugar intake) and increase physical activity as tolerated.  Continue doing brain stimulating activities (puzzles, reading, adult coloring books, staying active) to keep memory sharp.    Health Maintenance, Male A healthy lifestyle and preventive care is important for your health and wellness. Ask your health care provider about what schedule of regular examinations is right for you. What should I know about weight and diet? Eat a Healthy Diet  Eat plenty of vegetables, fruits, whole grains, low-fat dairy products, and lean protein.  Do not eat a lot of foods high in solid fats, added sugars, or salt.  Maintain a Healthy Weight Regular exercise can help you achieve or maintain a healthy weight. You should:  Do at least 150 minutes of exercise each week. The exercise should increase your heart rate and make you sweat  (moderate-intensity exercise).  Do strength-training exercises at least twice a week.  Watch Your Levels of Cholesterol and Blood Lipids  Have your blood tested for lipids and cholesterol every 5 years starting at 74 years of age. If you are at high risk for heart disease, you should start having your blood tested when you are 75 years old. You may need to have your cholesterol levels checked more often if: ? Your lipid or cholesterol levels are high. ? You are older than 75 years of age. ? You are at high risk for heart disease.  What should I know about cancer screening? Many types of cancers can be detected early and may often be prevented. Lung Cancer  You should be screened every year for lung cancer if: ? You are a current smoker who has smoked for at least 30 years. ? You are a former smoker who has quit within the past 15 years.  Talk to your health care provider about your screening options, when you should start screening, and how often you should be screened.  Colorectal Cancer  Routine colorectal cancer screening usually begins at 75 years of age and should be repeated every 5-10 years until you are 75 years old. You may need to be screened more often if early forms of precancerous polyps or small growths are found. Your health care provider may  recommend screening at an earlier age if you have risk factors for colon cancer.  Your health care provider may recommend using home test kits to check for hidden blood in the stool.  A small camera at the end of a tube can be used to examine your colon (sigmoidoscopy or colonoscopy). This checks for the earliest forms of colorectal cancer.  Prostate and Testicular Cancer  Depending on your age and overall health, your health care provider may do certain tests to screen for prostate and testicular cancer.  Talk to your health care provider about any symptoms or concerns you have about testicular or prostate cancer.  Skin  Cancer  Check your skin from head to toe regularly.  Tell your health care provider about any new moles or changes in moles, especially if: ? There is a change in a mole's size, shape, or color. ? You have a mole that is larger than a pencil eraser.  Always use sunscreen. Apply sunscreen liberally and repeat throughout the day.  Protect yourself by wearing long sleeves, pants, a wide-brimmed hat, and sunglasses when outside.  What should I know about heart disease, diabetes, and high blood pressure?  If you are 49-61 years of age, have your blood pressure checked every 3-5 years. If you are 54 years of age or older, have your blood pressure checked every year. You should have your blood pressure measured twice-once when you are at a hospital or clinic, and once when you are not at a hospital or clinic. Record the average of the two measurements. To check your blood pressure when you are not at a hospital or clinic, you can use: ? An automated blood pressure machine at a pharmacy. ? A home blood pressure monitor.  Talk to your health care provider about your target blood pressure.  If you are between 89-70 years old, ask your health care provider if you should take aspirin to prevent heart disease.  Have regular diabetes screenings by checking your fasting blood sugar level. ? If you are at a normal weight and have a low risk for diabetes, have this test once every three years after the age of 11. ? If you are overweight and have a high risk for diabetes, consider being tested at a younger age or more often.  A one-time screening for abdominal aortic aneurysm (AAA) by ultrasound is recommended for men aged 33-75 years who are current or former smokers. What should I know about preventing infection? Hepatitis B If you have a higher risk for hepatitis B, you should be screened for this virus. Talk with your health care provider to find out if you are at risk for hepatitis B  infection. Hepatitis C Blood testing is recommended for:  Everyone born from [birth year] through 1964.  Anyone with known risk factors for hepatitis C.  Sexually Transmitted Diseases (STDs)  You should be screened each year for STDs including gonorrhea and chlamydia if: ? You are sexually active and are younger than 75 years of age. ? You are older than 75 years of age and your health care provider tells you that you are at risk for this type of infection. ? Your sexual activity has changed since you were last screened and you are at an increased risk for chlamydia or gonorrhea. Ask your health care provider if you are at risk.  Talk with your health care provider about whether you are at high risk of being infected with HIV. Your health care provider may  recommend a prescription medicine to help prevent HIV infection.  Start either a vitamin B complex or add a Thiamine 100 mg tab daily, Folic Acid 1 mg tab daily  What else can I do?  Schedule regular health, dental, and eye exams.  Stay current with your vaccines (immunizations).  Do not use any tobacco products, such as cigarettes, chewing tobacco, and e-cigarettes. If you need help quitting, ask your health care provider.  Limit alcohol intake to no more than 2 drinks per day. One drink equals 12 ounces of beer, 5 ounces of wine, or 1 ounces of hard liquor.  Do not use street drugs.  Do not share needles.  Ask your health care provider for help if you need support or information about quitting drugs.  Tell your health care provider if you often feel depressed.  Tell your health care provider if you have ever been abused or do not feel safe at home. This information is not intended to replace advice given to you by your health care provider. Make sure you discuss any questions you have with your health care provider. Document Released: 03/15/2008 Document Revised: 05/16/2016 Document Reviewed: 06/21/2015 Elsevier Interactive  Patient Education  [employer].
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
## 2017-06-11 NOTE — Assessment & Plan Note (Signed)
Encouraged heart healthy diet, increase exercise, avoid trans fats, consider a krill oil cap daily 

## 2017-06-17 DIAGNOSIS — M5416 Radiculopathy, lumbar region: Secondary | ICD-10-CM | POA: Diagnosis not present

## 2017-06-17 DIAGNOSIS — M545 Low back pain: Secondary | ICD-10-CM | POA: Diagnosis not present

## 2017-06-24 DIAGNOSIS — M5416 Radiculopathy, lumbar region: Secondary | ICD-10-CM | POA: Diagnosis not present

## 2017-06-24 DIAGNOSIS — M545 Low back pain: Secondary | ICD-10-CM | POA: Diagnosis not present

## 2017-06-25 DIAGNOSIS — M5416 Radiculopathy, lumbar region: Secondary | ICD-10-CM | POA: Diagnosis not present

## 2017-10-23 ENCOUNTER — Encounter: Payer: Self-pay | Admitting: Family Medicine

## 2017-10-23 ENCOUNTER — Ambulatory Visit (INDEPENDENT_AMBULATORY_CARE_PROVIDER_SITE_OTHER): Payer: Medicare Other | Admitting: Family Medicine

## 2017-10-23 VITALS — BP 132/82 | HR 76 | Temp 98.3°F | Ht 68.0 in | Wt 211.1 lb

## 2017-10-23 DIAGNOSIS — M79674 Pain in right toe(s): Secondary | ICD-10-CM | POA: Diagnosis not present

## 2017-10-23 DIAGNOSIS — M79675 Pain in left toe(s): Secondary | ICD-10-CM

## 2017-10-23 MED ORDER — PREDNISONE 20 MG PO TABS: 20.0000 mg | ORAL_TABLET | Freq: Every day | ORAL | 0 refills | Status: AC

## 2017-10-23 NOTE — Progress Notes (Signed)
Pre visit review using our clinic review tool, if applicable. No additional management support is needed unless otherwise documented below in the visit note. 

## 2017-10-23 NOTE — Patient Instructions (Signed)
Mind the diet. I will send you a MyChart message regarding your results.  Ice/cold pack over area for 10-15 min every 2-3 hours while awake.  OK to take Tylenol 1000 mg (2 extra strength tabs) or 975 mg (3 regular strength tabs) every 6 hours as needed.  No ibuprofen while on the steroid.   Let us know if you need anything.
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
## 2017-10-24 LAB — BASIC METABOLIC PANEL
BUN: 32 mg/dL — ABNORMAL HIGH (ref 6–23)
CHLORIDE: 106 meq/L (ref 96–112)
CO2: 26 meq/L (ref 19–32)
Calcium: 9.9 mg/dL (ref 8.4–10.5)
Creatinine, Ser: 1.63 mg/dL — ABNORMAL HIGH (ref 0.40–1.50)
GFR: 44 mL/min — ABNORMAL LOW (ref >60.00)
Glucose, Bld: 94 mg/dL (ref 70–99)
Potassium: 4.8 mEq/L (ref 3.5–5.1)
Sodium: 141 mEq/L (ref 135–145)

## 2017-10-24 LAB — URIC ACID: URIC ACID, SERUM: 7 mg/dL (ref 4.0–7.8)

## 2017-11-05 ENCOUNTER — Encounter: Payer: Self-pay | Admitting: Family Medicine

## 2017-11-05 ENCOUNTER — Other Ambulatory Visit: Payer: Self-pay | Admitting: Family Medicine

## 2017-11-05 MED ORDER — ALLOPURINOL 100 MG PO TABS: 100.0000 mg | ORAL_TABLET | Freq: Every day | ORAL | 6 refills | Status: DC

## 2017-11-05 NOTE — Progress Notes (Signed)
Allopurinol called in. Pt instructed to schedule f/u w reg PCP in 4-6 weeks.

## 2018-03-07 ENCOUNTER — Other Ambulatory Visit: Payer: Self-pay | Admitting: Family Medicine

## 2018-05-22 ENCOUNTER — Other Ambulatory Visit: Payer: Self-pay | Admitting: Family Medicine

## 2018-05-26 ENCOUNTER — Other Ambulatory Visit: Payer: Self-pay | Admitting: Family Medicine

## 2018-05-28 MED ORDER — ENALAPRIL MALEATE 10 MG PO TABS: 10.0000 mg | ORAL_TABLET | Freq: Every day | ORAL | 0 refills | Status: DC

## 2018-05-28 MED ORDER — AMLODIPINE BESYLATE 2.5 MG PO TABS: 2.5000 mg | ORAL_TABLET | Freq: Every day | ORAL | 0 refills | Status: DC
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
## 2018-06-12 ENCOUNTER — Encounter: Payer: Self-pay | Admitting: *Deleted

## 2018-06-12 ENCOUNTER — Ambulatory Visit (INDEPENDENT_AMBULATORY_CARE_PROVIDER_SITE_OTHER): Payer: Medicare Other | Admitting: *Deleted

## 2018-06-12 VITALS — BP 152/90 | HR 69 | Ht 68.0 in | Wt 210.6 lb

## 2018-06-12 DIAGNOSIS — Z0000 Encounter for general adult medical examination without abnormal findings: Secondary | ICD-10-CM | POA: Diagnosis not present

## 2018-06-12 NOTE — Patient Instructions (Addendum)
Please schedule your next medicare wellness visit with me in 1 yr.  Schedule your yearly with Dr.Blyth and for BP follow up as directed.  Continue to eat heart healthy diet (full of fruits, vegetables, whole grains, lean protein, water--limit salt, fat, and sugar intake) and increase physical activity as tolerated.  Continue doing brain stimulating activities (puzzles, reading, adult coloring books, staying active) to keep memory sharp.    Edward Hernandez , Thank you for taking time to come for your Medicare Wellness Visit. I appreciate your ongoing commitment to your health goals. Please review the following plan we discussed and let me know if I can assist you in the future.   These are the goals we discussed: Goals    . DIET - INCREASE WATER INTAKE    . Increase physical activity. (pt-stated)     Increase walking.      . Lose 30 lbs by next year.   (pt-stated)     Watch portion sizes. Eat more vegetables than starches and protein.  Increase physical activity.         This is a list of the screening recommended for you and due dates:  Health Maintenance  Topic Date Due  . Flu Shot  12/30/2018*  . Tetanus Vaccine  12/01/2025  . Pneumonia vaccines  Completed  . Complete foot exam   Discontinued  . Hemoglobin A1C  Discontinued  . Eye exam for diabetics  Discontinued  *Topic was postponed. The date shown is not the original due date.    Health Maintenance, Male A healthy lifestyle and preventive care is important for your health and wellness. Ask your health care provider about what schedule of regular examinations is right for you. What should I know about weight and diet? Eat a Healthy Diet  Eat plenty of vegetables, fruits, whole grains, low-fat dairy products, and lean protein.  Do not eat a lot of foods high in solid fats, added sugars, or salt.  Maintain a Healthy Weight Regular exercise can help you achieve or maintain a healthy weight. You should:  Do at least 150  minutes of exercise each week. The exercise should increase your heart rate and make you sweat (moderate-intensity exercise).  Do strength-training exercises at least twice a week.  Watch Your Levels of Cholesterol and Blood Lipids  Have your blood tested for lipids and cholesterol every 5 years starting at 76 years of age. If you are at high risk for heart disease, you should start having your blood tested when you are 76 years old. You may need to have your cholesterol levels checked more often if: ? Your lipid or cholesterol levels are high. ? You are older than 76 years of age. ? You are at high risk for heart disease.  What should I know about cancer screening? Many types of cancers can be detected early and may often be prevented. Lung Cancer  You should be screened every year for lung cancer if: ? You are a current smoker who has smoked for at least 30 years. ? You are a former smoker who has quit within the past 15 years.  Talk to your health care provider about your screening options, when you should start screening, and how often you should be screened.  Colorectal Cancer  Routine colorectal cancer screening usually begins at 76 years of age and should be repeated every 5-10 years until you are 76 years old. You may need to be screened more often if early forms of  precancerous polyps or small growths are found. Your health care provider may recommend screening at an earlier age if you have risk factors for colon cancer.  Your health care provider may recommend using home test kits to check for hidden blood in the stool.  A small camera at the end of a tube can be used to examine your colon (sigmoidoscopy or colonoscopy). This checks for the earliest forms of colorectal cancer.  Prostate and Testicular Cancer  Depending on your age and overall health, your health care provider may do certain tests to screen for prostate and testicular cancer.  Talk to your health care  provider about any symptoms or concerns you have about testicular or prostate cancer.  Skin Cancer  Check your skin from head to toe regularly.  Tell your health care provider about any new moles or changes in moles, especially if: ? There is a change in a mole's size, shape, or color. ? You have a mole that is larger than a pencil eraser.  Always use sunscreen. Apply sunscreen liberally and repeat throughout the day.  Protect yourself by wearing long sleeves, pants, a wide-brimmed hat, and sunglasses when outside.  What should I know about heart disease, diabetes, and high blood pressure?  If you are 63-52 years of age, have your blood pressure checked every 3-5 years. If you are 48 years of age or older, have your blood pressure checked every year. You should have your blood pressure measured twice-once when you are at a hospital or clinic, and once when you are not at a hospital or clinic. Record the average of the two measurements. To check your blood pressure when you are not at a hospital or clinic, you can use: ? An automated blood pressure machine at a pharmacy. ? A home blood pressure monitor.  Talk to your health care provider about your target blood pressure.  If you are between 49-67 years old, ask your health care provider if you should take aspirin to prevent heart disease.  Have regular diabetes screenings by checking your fasting blood sugar level. ? If you are at a normal weight and have a low risk for diabetes, have this test once every three years after the age of 35. ? If you are overweight and have a high risk for diabetes, consider being tested at a younger age or more often.  A one-time screening for abdominal aortic aneurysm (AAA) by ultrasound is recommended for men aged 24-75 years who are current or former smokers. What should I know about preventing infection? Hepatitis B If you have a higher risk for hepatitis B, you should be screened for this virus. Talk  with your health care provider to find out if you are at risk for hepatitis B infection. Hepatitis C Blood testing is recommended for:  Everyone born from [birth year] through 1964.  Anyone with known risk factors for hepatitis C.  Sexually Transmitted Diseases (STDs)  You should be screened each year for STDs including gonorrhea and chlamydia if: ? You are sexually active and are younger than 76 years of age. ? You are older than 76 years of age and your health care provider tells you that you are at risk for this type of infection. ? Your sexual activity has changed since you were last screened and you are at an increased risk for chlamydia or gonorrhea. Ask your health care provider if you are at risk.  Talk with your health care provider about whether you are at  high risk of being infected with HIV. Your health care provider may recommend a prescription medicine to help prevent HIV infection.  What else can I do?  Schedule regular health, dental, and eye exams.  Stay current with your vaccines (immunizations).  Do not use any tobacco products, such as cigarettes, chewing tobacco, and e-cigarettes. If you need help quitting, ask your health care provider.  Limit alcohol intake to no more than 2 drinks per day. One drink equals 12 ounces of beer, 5 ounces of wine, or 1 ounces of hard liquor.  Do not use street drugs.  Do not share needles.  Ask your health care provider for help if you need support or information about quitting drugs.  Tell your health care provider if you often feel depressed.  Tell your health care provider if you have ever been abused or do not feel safe at home. This information is not intended to replace advice given to you by your health care provider. Make sure you discuss any questions you have with your health care provider. Document Released: 03/15/2008 Document Revised: 05/16/2016 Document Reviewed: 06/21/2015 Elsevier Interactive Patient Education  [employer].

## 2018-06-16 ENCOUNTER — Other Ambulatory Visit: Payer: Self-pay | Admitting: Family Medicine

## 2018-06-20 ENCOUNTER — Encounter: Payer: Self-pay | Admitting: Family Medicine

## 2018-06-23 ENCOUNTER — Telehealth: Payer: Self-pay

## 2018-06-23 DIAGNOSIS — N529 Male erectile dysfunction, unspecified: Secondary | ICD-10-CM

## 2018-06-23 NOTE — Telephone Encounter (Signed)
Author phoned pt. to confirm pharmacy, no answer. Author left VM asking for return call. Sildenafil OK to renew Rx per [name]; pt. Has OV with [name] 9/26. PEC OK to refill once pharmacy confirmed.

## 2018-06-24 ENCOUNTER — Telehealth: Payer: Self-pay

## 2018-06-24 MED ORDER — SILDENAFIL CITRATE 20 MG PO TABS: 20.0000 mg | ORAL_TABLET | Freq: Every day | ORAL | 1 refills | Status: DC | PRN

## 2018-06-24 NOTE — Telephone Encounter (Signed)
PA denied. Drugs being used for treatment of sexual dysfunction are excluded from Medicare Part D coverage.

## 2018-06-24 NOTE — Telephone Encounter (Signed)
PA initiated via Covermymeds; KEY: [id number]. Awaiting determination.

## 2018-06-24 NOTE — Telephone Encounter (Signed)
Patient states he will continue to see [name] as his PCP.Edward KitchenMarland Hernandez

## 2018-06-26 ENCOUNTER — Encounter: Payer: Self-pay | Admitting: Family Medicine

## 2018-06-26 ENCOUNTER — Ambulatory Visit: Payer: Medicare Other | Admitting: Family Medicine

## 2018-06-26 ENCOUNTER — Ambulatory Visit (INDEPENDENT_AMBULATORY_CARE_PROVIDER_SITE_OTHER): Payer: Medicare Other | Admitting: Family Medicine

## 2018-06-26 VITALS — BP 132/72 | HR 77 | Temp 98.5°F | Resp 18 | Wt 210.8 lb

## 2018-06-26 DIAGNOSIS — M79672 Pain in left foot: Secondary | ICD-10-CM

## 2018-06-26 DIAGNOSIS — E782 Mixed hyperlipidemia: Secondary | ICD-10-CM | POA: Diagnosis not present

## 2018-06-26 DIAGNOSIS — I10 Essential (primary) hypertension: Secondary | ICD-10-CM

## 2018-06-26 DIAGNOSIS — N259 Disorder resulting from impaired renal tubular function, unspecified: Secondary | ICD-10-CM

## 2018-06-26 DIAGNOSIS — E6609 Other obesity due to excess calories: Secondary | ICD-10-CM

## 2018-06-26 DIAGNOSIS — M79671 Pain in right foot: Secondary | ICD-10-CM | POA: Diagnosis not present

## 2018-06-26 DIAGNOSIS — R7309 Other abnormal glucose: Secondary | ICD-10-CM | POA: Diagnosis not present

## 2018-06-26 NOTE — Patient Instructions (Addendum)
Shingrix is the new shingles shot 2 shots over 2-6 months at pharmacy  Top number btween 100 and 140 Bottom number between 68 and 90 at rest Hypertension Hypertension is another name for high blood pressure. High blood pressure forces your heart to work harder to pump blood. This can cause problems over time. There are two numbers in a blood pressure reading. There is a top number (systolic) over a bottom number (diastolic). It is best to have a blood pressure below 120/80. Healthy choices can help lower your blood pressure. You may need medicine to help lower your blood pressure if:  Your blood pressure cannot be lowered with healthy choices.  Your blood pressure is higher than 130/80.  Follow these instructions at home: Eating and drinking  If directed, follow the DASH eating plan. This diet includes: ? Filling half of your plate at each meal with fruits and vegetables. ? Filling one quarter of your plate at each meal with whole grains. Whole grains include whole wheat pasta, brown rice, and whole grain bread. ? Eating or drinking low-fat dairy products, such as skim milk or low-fat yogurt. ? Filling one quarter of your plate at each meal with low-fat (lean) proteins. Low-fat proteins include fish, skinless chicken, eggs, beans, and tofu. ? Avoiding fatty meat, cured and processed meat, or chicken with skin. ? Avoiding premade or processed food.  Eat less than 1,500 mg of salt (sodium) a day.  Limit alcohol use to no more than 1 drink a day for nonpregnant women and 2 drinks a day for men. One drink equals 12 oz of beer, 5 oz of wine, or 1 oz of hard liquor. Lifestyle  Work with your doctor to stay at a healthy weight or to lose weight. Ask your doctor what the best weight is for you.  Get at least 30 minutes of exercise that causes your heart to beat faster (aerobic exercise) most days of the week. This may include walking, swimming, or biking.  Get at least 30 minutes of exercise  that strengthens your muscles (resistance exercise) at least 3 days a week. This may include lifting weights or pilates.  Do not use any products that contain nicotine or tobacco. This includes cigarettes and e-cigarettes. If you need help quitting, ask your doctor.  Check your blood pressure at home as told by your doctor.  Keep all follow-up visits as told by your doctor. This is important. Medicines  Take over-the-counter and prescription medicines only as told by your doctor. Follow directions carefully.  Do not skip doses of blood pressure medicine. The medicine does not work as well if you skip doses. Skipping doses also puts you at risk for problems.  Ask your doctor about side effects or reactions to medicines that you should watch for. Contact a doctor if:  You think you are having a reaction to the medicine you are taking.  You have headaches that keep coming back (recurring).  You feel dizzy.  You have swelling in your ankles.  You have trouble with your vision. Get help right away if:  You get a very bad headache.  You start to feel confused.  You feel weak or numb.  You feel faint.  You get very bad pain in your: ? Chest. ? Belly (abdomen).  You throw up (vomit) more than once.  You have trouble breathing. Summary  Hypertension is another name for high blood pressure.  Making healthy choices can help lower blood pressure. If your blood  pressure cannot be controlled with healthy choices, you may need to take medicine. This information is not intended to replace advice given to you by your health care provider. Make sure you discuss any questions you have with your health care provider. Document Released: 03/05/2008 Document Revised: 08/15/2016 Document Reviewed: 08/15/2016 Elsevier Interactive Patient Education  [employer].

## 2018-06-27 LAB — COMPREHENSIVE METABOLIC PANEL
ALT: 26 U/L (ref 0–53)
AST: 25 U/L (ref 0–37)
Albumin: 4.5 g/dL (ref 3.5–5.2)
Alkaline Phosphatase: 52 U/L (ref 39–117)
BILIRUBIN TOTAL: 0.7 mg/dL (ref 0.2–1.2)
BUN: 28 mg/dL — ABNORMAL HIGH (ref 6–23)
CHLORIDE: 106 meq/L (ref 96–112)
CO2: 25 meq/L (ref 19–32)
Calcium: 10 mg/dL (ref 8.4–10.5)
Creatinine, Ser: 1.67 mg/dL — ABNORMAL HIGH (ref 0.40–1.50)
GFR: 42.71 mL/min — AB (ref >60.00)
GLUCOSE: 87 mg/dL (ref 70–99)
Potassium: 4.5 mEq/L (ref 3.5–5.1)
Sodium: 138 mEq/L (ref 135–145)
Total Protein: 7.1 g/dL (ref 6.0–8.3)

## 2018-06-27 LAB — CBC
HCT: 41.5 % (ref 39.0–52.0)
HEMOGLOBIN: 14.1 g/dL (ref 13.0–17.0)
MCHC: 33.9 g/dL (ref 30.0–36.0)
MCV: 92.4 fl (ref 78.0–100.0)
Platelets: 182 10*3/uL (ref 150.0–400.0)
RBC: 4.49 Mil/uL (ref 4.22–5.81)
RDW: 14.1 % (ref 11.5–15.5)
WBC: 7.1 10*3/uL (ref 4.0–10.5)

## 2018-06-27 LAB — URIC ACID: Uric Acid, Serum: 6.7 mg/dL (ref 4.0–7.8)

## 2018-06-27 LAB — LIPID PANEL
CHOL/HDL RATIO: 5
Cholesterol: 167 mg/dL (ref 0–200)
HDL: 35.8 mg/dL — AB (ref >39.00)
NONHDL: 130.73
TRIGLYCERIDES: 217 mg/dL — AB (ref 0.0–149.0)
VLDL: 43.4 mg/dL — AB (ref 0.0–40.0)

## 2018-06-27 LAB — HEMOGLOBIN A1C: HEMOGLOBIN A1C: 6.3 % (ref 4.6–6.5)

## 2018-06-27 LAB — TSH: TSH: 1.23 u[IU]/mL (ref 0.35–4.50)

## 2018-06-27 LAB — LDL CHOLESTEROL, DIRECT: Direct LDL: 102 mg/dL
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
## 2018-06-29 NOTE — Assessment & Plan Note (Signed)
Improved on recheck. No new concerns. Monitor and let us know if numbers trend up or he becomes symptomatic

## 2018-06-29 NOTE — Assessment & Plan Note (Signed)
Encouraged heart healthy diet, increase exercise, avoid trans fats, consider a krill oil cap daily 

## 2018-06-29 NOTE — Assessment & Plan Note (Signed)
Renal functions stable. Continue to hydrate and monitor

## 2018-06-29 NOTE — Assessment & Plan Note (Signed)
Encouraged DASH diet, decrease po intake and increase exercise as tolerated. Needs 7-8 hours of sleep nightly. Avoid trans fats, eat small, frequent meals every 4-5 hours with lean proteins, complex carbs and healthy fats. Minimize simple carbs 

## 2018-06-29 NOTE — Assessment & Plan Note (Signed)
hgba1c acceptable, minimize simple carbs. Increase exercise as tolerated.  

## 2018-07-13 ENCOUNTER — Other Ambulatory Visit: Payer: Self-pay | Admitting: Family Medicine

## 2018-07-28 ENCOUNTER — Encounter: Payer: Self-pay | Admitting: Gastroenterology

## 2018-08-25 DIAGNOSIS — L570 Actinic keratosis: Secondary | ICD-10-CM | POA: Diagnosis not present

## 2018-08-25 DIAGNOSIS — Z85828 Personal history of other malignant neoplasm of skin: Secondary | ICD-10-CM | POA: Diagnosis not present

## 2018-08-25 DIAGNOSIS — Z08 Encounter for follow-up examination after completed treatment for malignant neoplasm: Secondary | ICD-10-CM | POA: Diagnosis not present

## 2018-08-25 DIAGNOSIS — L821 Other seborrheic keratosis: Secondary | ICD-10-CM | POA: Diagnosis not present

## 2018-12-17 ENCOUNTER — Other Ambulatory Visit: Payer: Self-pay | Admitting: Family Medicine

## 2018-12-17 MED ORDER — ENALAPRIL MALEATE 10 MG PO TABS: 10.0000 mg | ORAL_TABLET | Freq: Every day | ORAL | 0 refills | Status: DC

## 2018-12-17 MED ORDER — AMLODIPINE BESYLATE 2.5 MG PO TABS: 2.5000 mg | ORAL_TABLET | Freq: Every day | ORAL | 0 refills | Status: DC

## 2019-03-11 ENCOUNTER — Other Ambulatory Visit: Payer: Self-pay | Admitting: Family Medicine

## 2019-06-05 ENCOUNTER — Other Ambulatory Visit: Payer: Self-pay | Admitting: Family Medicine

## 2019-06-05 NOTE — Telephone Encounter (Signed)
Patient need to schedule an ov for more refills. 
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
## 2019-06-22 ENCOUNTER — Other Ambulatory Visit: Payer: Self-pay

## 2019-06-22 ENCOUNTER — Encounter: Payer: Self-pay | Admitting: *Deleted

## 2019-06-22 ENCOUNTER — Ambulatory Visit (INDEPENDENT_AMBULATORY_CARE_PROVIDER_SITE_OTHER): Payer: Medicare Other | Admitting: *Deleted

## 2019-06-22 DIAGNOSIS — Z0000 Encounter for general adult medical examination without abnormal findings: Secondary | ICD-10-CM | POA: Diagnosis not present

## 2019-06-22 NOTE — Patient Instructions (Signed)
Please schedule your next medicare wellness visit with me in 1 yr.  Continue to eat heart healthy diet (full of fruits, vegetables, whole grains, lean protein, water--limit salt, fat, and sugar intake) and increase physical activity as tolerated.  Continue doing brain stimulating activities (puzzles, reading, adult coloring books, staying active) to keep memory sharp.   Bring a copy of your living will and/or healthcare power of attorney to your next office visit.   Edward Hernandez , Thank you for taking time to come for your Medicare Wellness Visit. I appreciate your ongoing commitment to your health goals. Please review the following plan we discussed and let me know if I can assist you in the future.   These are the goals we discussed: Goals    . DIET - INCREASE WATER INTAKE    . Increase physical activity. (pt-stated)     Increase walking.      . Lose 30 lbs by next year.   (pt-stated)     Watch portion sizes. Eat more vegetables than starches and protein.  Increase physical activity.         This is a list of the screening recommended for you and due dates:  Health Maintenance  Topic Date Due  . Flu Shot  05/02/2019  . Tetanus Vaccine  12/01/2025  . Pneumonia vaccines  Completed  . Complete foot exam   Discontinued  . Hemoglobin A1C  Discontinued  . Eye exam for diabetics  Discontinued    Health Maintenance After Age 52 After age 52, you are at a higher risk for certain long-term diseases and infections as well as injuries from falls. Falls are a major cause of broken bones and head injuries in people who are older than age 80. Getting regular preventive care can help to keep you healthy and well. Preventive care includes getting regular testing and making lifestyle changes as recommended by your health care provider. Talk with your health care provider about:  Which screenings and tests you should have. A screening is a test that checks for a disease when you have no  symptoms.  A diet and exercise plan that is right for you. What should I know about screenings and tests to prevent falls? Screening and testing are the best ways to find a health problem early. Early diagnosis and treatment give you the best chance of managing medical conditions that are common after age 6. Certain conditions and lifestyle choices may make you more likely to have a fall. Your health care provider may recommend:  Regular vision checks. Poor vision and conditions such as cataracts can make you more likely to have a fall. If you wear glasses, make sure to get your prescription updated if your vision changes.  Medicine review. Work with your health care provider to regularly review all of the medicines you are taking, including over-the-counter medicines. Ask your health care provider about any side effects that may make you more likely to have a fall. Tell your health care provider if any medicines that you take make you feel dizzy or sleepy.  Osteoporosis screening. Osteoporosis is a condition that causes the bones to get weaker. This can make the bones weak and cause them to break more easily.  Blood pressure screening. Blood pressure changes and medicines to control blood pressure can make you feel dizzy.  Strength and balance checks. Your health care provider may recommend certain tests to check your strength and balance while standing, walking, or changing positions.  Foot  health exam. Foot pain and numbness, as well as not wearing proper footwear, can make you more likely to have a fall.  Depression screening. You may be more likely to have a fall if you have a fear of falling, feel emotionally low, or feel unable to do activities that you used to do.  Alcohol use screening. Using too much alcohol can affect your balance and may make you more likely to have a fall. What actions can I take to lower my risk of falls? General instructions  Talk with your health care  provider about your risks for falling. Tell your health care provider if: ? You fall. Be sure to tell your health care provider about all falls, even ones that seem minor. ? You feel dizzy, sleepy, or off-balance.  Take over-the-counter and prescription medicines only as told by your health care provider. These include any supplements.  Eat a healthy diet and maintain a healthy weight. A healthy diet includes low-fat dairy products, low-fat (lean) meats, and fiber from whole grains, beans, and lots of fruits and vegetables. Home safety  Remove any tripping hazards, such as rugs, cords, and clutter.  Install safety equipment such as grab bars in bathrooms and safety rails on stairs.  Keep rooms and walkways well-lit. Activity   Follow a regular exercise program to stay fit. This will help you maintain your balance. Ask your health care provider what types of exercise are appropriate for you.  If you need a cane or walker, use it as recommended by your health care provider.  Wear supportive shoes that have nonskid soles. Lifestyle  Do not drink alcohol if your health care provider tells you not to drink.  If you drink alcohol, limit how much you have: ? 0-1 drink a day for women. ? 0-2 drinks a day for men.  Be aware of how much alcohol is in your drink. In the [place] one drink equals one typical bottle of beer (12 oz), one-half glass of wine (5 oz), or one shot of hard liquor (1 oz).  Do not use any products that contain nicotine or tobacco, such as cigarettes and e-cigarettes. If you need help quitting, ask your health care provider. Summary  Having a healthy lifestyle and getting preventive care can help to protect your health and wellness after age 11.  Screening and testing are the best way to find a health problem early and help you avoid having a fall. Early diagnosis and treatment give you the best chance for managing medical conditions that are more common for people who  are older than age 23.  Falls are a major cause of broken bones and head injuries in people who are older than age 12. Take precautions to prevent a fall at home.  Work with your health care provider to learn what changes you can make to improve your health and wellness and to prevent falls. This information is not intended to replace advice given to you by your health care provider. Make sure you discuss any questions you have with your health care provider. Document Released: 07/31/2017 Document Revised: 01/08/2019 Document Reviewed: 07/31/2017 Elsevier Patient Education  2020 [employer].

## 2019-07-04 DIAGNOSIS — I10 Essential (primary) hypertension: Secondary | ICD-10-CM | POA: Diagnosis not present

## 2019-07-04 DIAGNOSIS — Z760 Encounter for issue of repeat prescription: Secondary | ICD-10-CM | POA: Diagnosis not present

## 2019-07-04 DIAGNOSIS — Z23 Encounter for immunization: Secondary | ICD-10-CM | POA: Diagnosis not present

## 2019-07-04 DIAGNOSIS — Z0131 Encounter for examination of blood pressure with abnormal findings: Secondary | ICD-10-CM | POA: Diagnosis not present

## 2019-07-27 DIAGNOSIS — S99921A Unspecified injury of right foot, initial encounter: Secondary | ICD-10-CM | POA: Diagnosis not present

## 2019-07-27 DIAGNOSIS — L089 Local infection of the skin and subcutaneous tissue, unspecified: Secondary | ICD-10-CM | POA: Diagnosis not present

## 2019-07-27 DIAGNOSIS — B351 Tinea unguium: Secondary | ICD-10-CM | POA: Diagnosis not present

## 2019-10-08 ENCOUNTER — Telehealth: Payer: Self-pay

## 2019-10-08 NOTE — Telephone Encounter (Signed)
Copied from [place] [phone]. Topic: General - Other >> Oct 07, 2019  3:00 PM Leward Quan A wrote: Reason for CRM: Mavis with [facility] Call called to say that patient A1c was 6.1 and BP was 140/84 and then 150/90 say she just wanted Dr Charlett Blake to be aware. Any questions please call patient or call Mavis Ph# [phone]

## 2019-11-08 ENCOUNTER — Ambulatory Visit: Payer: Medicare Other | Attending: Internal Medicine

## 2019-11-08 DIAGNOSIS — Z23 Encounter for immunization: Secondary | ICD-10-CM

## 2019-11-08 NOTE — Progress Notes (Signed)
   Covid-19 Vaccination Clinic  Name:  Edward Hernandez    MRN: [id number] DOB: 09/28/1941  11/08/2019  Edward Hernandez was observed post Covid-19 immunization for 15 minutes without incidence. He was provided with Vaccine Information Sheet and instruction to access the V-Safe system.   Edward Hernandez was instructed to call 911 with any severe reactions post vaccine: Marland Kitchen Difficulty breathing  . Swelling of your face and throat  . A fast heartbeat  . A bad rash all over your body  . Dizziness and weakness    Immunizations Administered    Name Date Dose VIS Date Route   Pfizer COVID-19 Vaccine 11/08/2019  3:15 AM 0.3 mL 09/11/2019 Intramuscular   Manufacturer: [place]   Lot: [id number]   [place]: [id number]

## 2019-11-24 ENCOUNTER — Ambulatory Visit: Payer: Medicare Other

## 2019-12-02 ENCOUNTER — Ambulatory Visit: Payer: Medicare Other | Attending: Internal Medicine

## 2019-12-02 ENCOUNTER — Ambulatory Visit: Payer: Medicare Other

## 2019-12-02 DIAGNOSIS — Z23 Encounter for immunization: Secondary | ICD-10-CM | POA: Insufficient documentation

## 2019-12-02 NOTE — Progress Notes (Signed)
   Covid-19 Vaccination Clinic  Name:  Edward Hernandez    MRN: [id number] DOB: May 16, 1941  12/02/2019  [name] was observed post Covid-19 immunization for 15 minutes without incident. He was provided with Vaccine Information Sheet and instruction to access the V-Safe system.   [name] was instructed to call 911 with any severe reactions post vaccine: Marland Kitchen Difficulty breathing  . Swelling of face and throat  . A fast heartbeat  . A bad rash all over body  . Dizziness and weakness   Immunizations Administered    Name Date Dose VIS Date Route   Pfizer COVID-19 Vaccine 12/02/2019  8:30 AM 0.3 mL 09/11/2019 Intramuscular   Manufacturer: [place]   Lot: [id number]   [place]: [id number]

## 2020-03-31 ENCOUNTER — Ambulatory Visit: Payer: Self-pay

## 2020-03-31 ENCOUNTER — Encounter: Payer: Self-pay | Admitting: Family Medicine

## 2020-03-31 ENCOUNTER — Other Ambulatory Visit: Payer: Self-pay

## 2020-03-31 ENCOUNTER — Ambulatory Visit (INDEPENDENT_AMBULATORY_CARE_PROVIDER_SITE_OTHER): Payer: Medicare Other | Admitting: Family Medicine

## 2020-03-31 VITALS — BP 144/85 | HR 77 | Ht 68.0 in | Wt 208.0 lb

## 2020-03-31 DIAGNOSIS — S76301A Unspecified injury of muscle, fascia and tendon of the posterior muscle group at thigh level, right thigh, initial encounter: Secondary | ICD-10-CM

## 2020-03-31 DIAGNOSIS — M7631 Iliotibial band syndrome, right leg: Secondary | ICD-10-CM | POA: Insufficient documentation

## 2020-03-31 MED ORDER — PREDNISONE 20 MG PO TABS: ORAL_TABLET | ORAL | 0 refills | Status: DC
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
## 2020-03-31 NOTE — Patient Instructions (Signed)
Nice to meet you Please try the prednisone  Please stop the ibuprofen  Please continue the brace  Please try ice   Please send me a message in [place] with any questions or updates.  Please see me back in 2 weeks. Let me know by next week if no better.   --Dr. Raeford Razor

## 2020-03-31 NOTE — Assessment & Plan Note (Signed)
Unclear inflammatory changes and effusion of the hamstrings and vastus lateralis.  Possibly related to gout.  Seems less likely for infectious.  Nothing seems ruptured or torn at this point. -Counseled on supportive care. -Stop ibuprofen and initiate prednisone. -If no improvement will consider lab work to check for infection and consideration of MRI.

## 2020-04-14 ENCOUNTER — Other Ambulatory Visit: Payer: Self-pay

## 2020-04-14 ENCOUNTER — Encounter: Payer: Self-pay | Admitting: Family Medicine

## 2020-04-14 ENCOUNTER — Ambulatory Visit: Payer: Medicare Other | Admitting: Family Medicine

## 2020-04-14 DIAGNOSIS — M7631 Iliotibial band syndrome, right leg: Secondary | ICD-10-CM | POA: Diagnosis not present

## 2020-04-14 DIAGNOSIS — R748 Abnormal levels of other serum enzymes: Secondary | ICD-10-CM

## 2020-04-14 DIAGNOSIS — M10061 Idiopathic gout, right knee: Secondary | ICD-10-CM

## 2020-04-14 DIAGNOSIS — R768 Other specified abnormal immunological findings in serum: Secondary | ICD-10-CM | POA: Insufficient documentation

## 2020-04-14 NOTE — Assessment & Plan Note (Signed)
Appears that his symptoms are related to a gouty attack.  Abnormal visualization on ultrasound previously was likely related to inflammatory changes due to gout.  Last uric acid was in 2019.  Had significant response with the prednisone.  Does have an elevated creatinine.  Advised to avoid ibuprofen. -Counseled on supportive care. -Uric acid and CMP. -Provided rayos sample  -Continue allopurinol. -May need to have an extended course of prednisone if needed.

## 2020-04-14 NOTE — Assessment & Plan Note (Signed)
Symptoms have improved.  No inflammatory change appreciated over the distal hamstring or IT band.  It seems that he had a gouty irritation of the IT band itself as well as the distal hamstring. -Counseled on supportive care. -Can get back to doing tai chi. -Could consider imaging or physical therapy if necessary.

## 2020-04-14 NOTE — Progress Notes (Signed)
Medication Samples have been provided to the patient.  Drug name: Rayos       Strength: 5mg         Qty: 1 Box  LOT:   Exp.Date: [id number]  Dosing instructions: take 2 tablets as close to 10 pm as possible.  The patient has been instructed regarding the correct time, dose, and frequency of taking this medication, including desired effects and most common side effects.   Sherrie George, MA 9:31 AM 04/14/2020

## 2020-04-14 NOTE — Patient Instructions (Signed)
Good to see you Please try the rayos  We will let you know about the results from today.   Please send me a message in MyChart with any questions or updates.  Please see me back in 4 weeks.    --Dr. Raeford Razor
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
## 2020-04-15 ENCOUNTER — Other Ambulatory Visit: Payer: Self-pay | Admitting: Family Medicine

## 2020-04-15 LAB — COMPREHENSIVE METABOLIC PANEL
ALT: 33 IU/L (ref 0–44)
AST: 19 IU/L (ref 0–40)
Albumin/Globulin Ratio: 1.5 (ref 1.2–2.2)
Albumin: 4.3 g/dL (ref 3.7–4.7)
Alkaline Phosphatase: 82 IU/L (ref 48–121)
BUN/Creatinine Ratio: 16 (ref 10–24)
BUN: 29 mg/dL — ABNORMAL HIGH (ref 8–27)
Bilirubin Total: 0.7 mg/dL (ref 0.0–1.2)
CO2: 21 mmol/L (ref 20–29)
Calcium: 10.7 mg/dL — ABNORMAL HIGH (ref 8.6–10.2)
Chloride: 104 mmol/L (ref 96–106)
Creatinine, Ser: 1.81 mg/dL — ABNORMAL HIGH (ref 0.76–1.27)
GFR calc Af Amer: 41 mL/min/{1.73_m2} — ABNORMAL LOW (ref >59)
GFR calc non Af Amer: 35 mL/min/{1.73_m2} — ABNORMAL LOW (ref >59)
Globulin, Total: 2.8 g/dL (ref 1.5–4.5)
Glucose: 114 mg/dL — ABNORMAL HIGH (ref 65–99)
Potassium: 4.7 mmol/L (ref 3.5–5.2)
Sodium: 142 mmol/L (ref 134–144)
Total Protein: 7.1 g/dL (ref 6.0–8.5)

## 2020-04-15 LAB — URIC ACID: Uric Acid: 5.5 mg/dL (ref 3.8–8.4)

## 2020-04-15 MED ORDER — ENALAPRIL MALEATE 10 MG PO TABS: 10.0000 mg | ORAL_TABLET | Freq: Every day | ORAL | 0 refills | Status: DC

## 2020-04-15 MED ORDER — AMLODIPINE BESYLATE 2.5 MG PO TABS: 2.5000 mg | ORAL_TABLET | Freq: Every day | ORAL | 0 refills | Status: DC

## 2020-04-15 MED ORDER — ALLOPURINOL 100 MG PO TABS: 100.0000 mg | ORAL_TABLET | Freq: Every day | ORAL | 0 refills | Status: DC

## 2020-04-17 ENCOUNTER — Encounter: Payer: Self-pay | Admitting: Family Medicine

## 2020-04-25 ENCOUNTER — Telehealth: Payer: Self-pay | Admitting: Family Medicine

## 2020-04-25 DIAGNOSIS — M10061 Idiopathic gout, right knee: Secondary | ICD-10-CM | POA: Diagnosis not present

## 2020-04-25 NOTE — Telephone Encounter (Signed)
Spoke with patient about his symptoms.  Still having intermittent pain.  Does get improvement with the prednisone.  Gout is still a possibility but uric acid is within normal limits.  His kidney function has hampered however.  Unclear if any form of other inflammatory source. Will obtain further labs and provide rayos sample  Rosemarie Ax, MD [facility] 04/25/2020, 8:50 AM

## 2020-04-27 ENCOUNTER — Telehealth: Payer: Self-pay | Admitting: Family Medicine

## 2020-04-27 DIAGNOSIS — R768 Other specified abnormal immunological findings in serum: Secondary | ICD-10-CM

## 2020-04-27 DIAGNOSIS — R748 Abnormal levels of other serum enzymes: Secondary | ICD-10-CM

## 2020-04-27 NOTE — Telephone Encounter (Signed)
Spoke with patient about his results. His anti-ccp was elevated. Will refer to rheum. Can provide rayos samples as needed. Will send to [facility] for his elevated Scr since he wasn't connect with [facility].   Rosemarie Ax, MD [facility] 04/27/2020, 8:47 AM

## 2020-04-27 NOTE — Telephone Encounter (Signed)
Provider referred pt to [facility] for treatment-  Pt packet of  :OV notes, ins info & referral faxed to [facility] @ [place] [facility] [phone].(they will contact pt to schedule appt directly). fyi  --glh

## 2020-04-27 NOTE — Telephone Encounter (Signed)
Provider referred pt to :   Note   [facility]  [address]  [place]  Phone [phone]         spk w/ Ivin Booty @ [facility] dept --faxed her all related information regarding pt  OV notes, ins info & referral  To [phone].  --[facility] will contact pt to establish appt date & time.  --glh

## 2020-04-29 LAB — VITAMIN D 1,25 DIHYDROXY
Vitamin D 1, 25 (OH)2 Total: 25 pg/mL
Vitamin D2 1, 25 (OH)2: <10 pg/mL
Vitamin D3 1, 25 (OH)2: 22 pg/mL

## 2020-04-29 LAB — ANA,IFA RA DIAG PNL W/RFLX TIT/PATN
ANA Titer 1: NEGATIVE
Cyclic Citrullin Peptide Ab: 23 units — ABNORMAL HIGH (ref 0–19)
Rheumatoid fact SerPl-aCnc: <10 IU/mL (ref 0.0–13.9)

## 2020-04-29 LAB — CBC WITH DIFFERENTIAL
Basophils Absolute: 0 10*3/uL (ref 0.0–0.2)
Basos: 0 %
EOS (ABSOLUTE): 0.1 10*3/uL (ref 0.0–0.4)
Eos: 1 %
Hematocrit: 40.4 % (ref 37.5–51.0)
Hemoglobin: 13.8 g/dL (ref 13.0–17.7)
Immature Grans (Abs): 0.1 10*3/uL (ref 0.0–0.1)
Immature Granulocytes: 1 %
Lymphocytes Absolute: 2.4 10*3/uL (ref 0.7–3.1)
Lymphs: 25 %
MCH: 31.2 pg (ref 26.6–33.0)
MCHC: 34.2 g/dL (ref 31.5–35.7)
MCV: 91 fL (ref 79–97)
Monocytes Absolute: 1.5 10*3/uL — ABNORMAL HIGH (ref 0.1–0.9)
Monocytes: 16 %
Neutrophils Absolute: 5.4 10*3/uL (ref 1.4–7.0)
Neutrophils: 57 %
RBC: 4.43 x10E6/uL (ref 4.14–5.80)
RDW: 12.6 % (ref 11.6–15.4)
WBC: 9.5 10*3/uL (ref 3.4–10.8)

## 2020-04-29 LAB — C-REACTIVE PROTEIN: CRP: 13 mg/L — ABNORMAL HIGH (ref 0–10)

## 2020-04-29 LAB — SEDIMENTATION RATE: Sed Rate: 26 mm/hr (ref 0–30)

## 2020-05-03 ENCOUNTER — Encounter: Payer: Self-pay | Admitting: Family Medicine

## 2020-05-03 ENCOUNTER — Telehealth: Payer: Self-pay | Admitting: Family Medicine

## 2020-05-03 NOTE — Telephone Encounter (Signed)
Pt sent msg thru Mychart that no call from Rheumatology regarding appt---  Called [facility] spk w/Nikki who states they need most recent  uric acid & CMP lab results ( labs faxed to them # [phone]) --- They will contact pt to set appt after labs review by provider.  --FYI--glh

## 2020-05-04 ENCOUNTER — Encounter: Payer: Self-pay | Admitting: Family Medicine

## 2020-05-04 ENCOUNTER — Telehealth: Payer: Self-pay | Admitting: Family Medicine

## 2020-05-04 NOTE — Addendum Note (Signed)
Addended by: Sherrie George F on: 05/04/2020 02:12 PM   Modules accepted: Orders

## 2020-05-04 NOTE — Telephone Encounter (Signed)
Pt & wife  called states They do no wish to drive to [facility] for appt but would rather go to [facility] here on [address] in [facility] w/ Dr Dimas Aguas @ [phone] ( called their office spoke w/ Altha Harm who says they will need / Lab results , OV notes, Ins information, & referral )  ---They will contact pt after receiving information to set up Appt.  FYI----  --Faxing all pertinant information to her @ [phone] today 05/04/20.  -- consulted w/ Dr. Raeford Razor to make sure switching Neph specialist, he agreed as long as pt gets seen sooner rather than later.  --glh

## 2020-05-10 ENCOUNTER — Encounter: Payer: Self-pay | Admitting: Family Medicine

## 2020-05-10 ENCOUNTER — Other Ambulatory Visit: Payer: Self-pay

## 2020-05-10 ENCOUNTER — Ambulatory Visit: Payer: Medicare Other | Admitting: Family Medicine

## 2020-05-10 VITALS — BP 156/105 | HR 89 | Ht 68.0 in | Wt 208.0 lb

## 2020-05-10 DIAGNOSIS — N1832 Chronic kidney disease, stage 3b: Secondary | ICD-10-CM | POA: Diagnosis not present

## 2020-05-10 DIAGNOSIS — R768 Other specified abnormal immunological findings in serum: Secondary | ICD-10-CM | POA: Diagnosis not present

## 2020-05-10 NOTE — Patient Instructions (Signed)
Good to see you Please let us know if you don't hear from the kidney doctor or the rheumatologist  Please take one pill as close to 10 pm of Rayos   Please send me a message in [place] with any questions or updates.  Please see me back in 2-3 months .   --Dr. Raeford Razor

## 2020-05-10 NOTE — Progress Notes (Signed)
Medication Samples have been provided to the patient.  Drug name: Rayos       Strength: 5mg        Qty: 2 boxes  LOT: [id number]  Exp.Date: 08/2020  Dosing instructions: take 1 tablet by mouth as close to 10 pm as possible.  The patient has been instructed regarding the correct time, dose, and frequency of taking this medication, including desired effects and most common side effects.   Sherrie George, [place] 10:43 AM 05/10/2020
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
## 2020-05-10 NOTE — Assessment & Plan Note (Signed)
His knee pain and leg pain have been controlled with low-dose of Rayos at night.  Initially thought to be gout but anti-CCP was mildly elevated.  Likely some form of inflammatory process.  Unclear if this is related to his kidneys.  -Provided Rayos samples. -Has rheumatology referral in place.

## 2020-05-10 NOTE — Assessment & Plan Note (Signed)
Is currently establishing with nephrology.

## 2020-05-23 DIAGNOSIS — L570 Actinic keratosis: Secondary | ICD-10-CM | POA: Diagnosis not present

## 2020-05-23 DIAGNOSIS — Z859 Personal history of malignant neoplasm, unspecified: Secondary | ICD-10-CM | POA: Diagnosis not present

## 2020-05-23 DIAGNOSIS — L821 Other seborrheic keratosis: Secondary | ICD-10-CM | POA: Diagnosis not present

## 2020-05-23 DIAGNOSIS — L820 Inflamed seborrheic keratosis: Secondary | ICD-10-CM | POA: Diagnosis not present

## 2020-05-26 DIAGNOSIS — H35342 Macular cyst, hole, or pseudohole, left eye: Secondary | ICD-10-CM | POA: Diagnosis not present

## 2020-05-26 DIAGNOSIS — Z961 Presence of intraocular lens: Secondary | ICD-10-CM | POA: Diagnosis not present

## 2020-05-31 ENCOUNTER — Other Ambulatory Visit: Payer: Self-pay | Admitting: Family Medicine

## 2020-06-01 DIAGNOSIS — R768 Other specified abnormal immunological findings in serum: Secondary | ICD-10-CM | POA: Diagnosis not present

## 2020-06-01 DIAGNOSIS — M25561 Pain in right knee: Secondary | ICD-10-CM | POA: Diagnosis not present

## 2020-06-07 DIAGNOSIS — I10 Essential (primary) hypertension: Secondary | ICD-10-CM | POA: Diagnosis not present

## 2020-06-07 DIAGNOSIS — N390 Urinary tract infection, site not specified: Secondary | ICD-10-CM | POA: Diagnosis not present

## 2020-06-07 DIAGNOSIS — R748 Abnormal levels of other serum enzymes: Secondary | ICD-10-CM | POA: Diagnosis not present

## 2020-06-09 ENCOUNTER — Other Ambulatory Visit (HOSPITAL_BASED_OUTPATIENT_CLINIC_OR_DEPARTMENT_OTHER): Payer: Self-pay | Admitting: Internal Medicine

## 2020-06-09 DIAGNOSIS — N183 Chronic kidney disease, stage 3 unspecified: Secondary | ICD-10-CM | POA: Diagnosis not present

## 2020-06-09 DIAGNOSIS — I10 Essential (primary) hypertension: Secondary | ICD-10-CM | POA: Diagnosis not present

## 2020-06-13 ENCOUNTER — Other Ambulatory Visit: Payer: Self-pay

## 2020-06-13 ENCOUNTER — Ambulatory Visit (HOSPITAL_BASED_OUTPATIENT_CLINIC_OR_DEPARTMENT_OTHER)
Admission: RE | Admit: 2020-06-13 | Discharge: 2020-06-13 | Disposition: A | Discharge status: Home / Self Care | Payer: Medicare Other | Source: Ambulatory Visit | Attending: Internal Medicine | Admitting: Internal Medicine

## 2020-06-13 DIAGNOSIS — N183 Chronic kidney disease, stage 3 unspecified: Secondary | ICD-10-CM | POA: Diagnosis not present

## 2020-06-13 DIAGNOSIS — N189 Chronic kidney disease, unspecified: Secondary | ICD-10-CM | POA: Diagnosis not present

## 2020-06-15 DIAGNOSIS — M25561 Pain in right knee: Secondary | ICD-10-CM | POA: Diagnosis not present

## 2020-07-14 ENCOUNTER — Other Ambulatory Visit: Payer: Self-pay | Admitting: Family Medicine

## 2020-08-03 DIAGNOSIS — R748 Abnormal levels of other serum enzymes: Secondary | ICD-10-CM | POA: Diagnosis not present

## 2020-08-03 DIAGNOSIS — N390 Urinary tract infection, site not specified: Secondary | ICD-10-CM | POA: Diagnosis not present

## 2020-08-03 DIAGNOSIS — I10 Essential (primary) hypertension: Secondary | ICD-10-CM | POA: Diagnosis not present

## 2020-08-03 DIAGNOSIS — N183 Chronic kidney disease, stage 3 unspecified: Secondary | ICD-10-CM | POA: Diagnosis not present

## 2020-08-09 ENCOUNTER — Other Ambulatory Visit: Payer: Self-pay | Admitting: Family Medicine

## 2020-09-07 ENCOUNTER — Other Ambulatory Visit: Payer: Self-pay | Admitting: Family Medicine

## 2020-09-12 ENCOUNTER — Other Ambulatory Visit: Payer: Self-pay | Admitting: Family Medicine

## 2020-09-13 ENCOUNTER — Other Ambulatory Visit: Payer: Self-pay | Admitting: Family Medicine

## 2020-09-14 DIAGNOSIS — M25561 Pain in right knee: Secondary | ICD-10-CM | POA: Diagnosis not present

## 2020-09-19 DIAGNOSIS — N183 Chronic kidney disease, stage 3 unspecified: Secondary | ICD-10-CM | POA: Diagnosis not present

## 2020-09-26 DIAGNOSIS — R748 Abnormal levels of other serum enzymes: Secondary | ICD-10-CM | POA: Diagnosis not present

## 2020-09-26 DIAGNOSIS — I10 Essential (primary) hypertension: Secondary | ICD-10-CM | POA: Diagnosis not present

## 2020-09-26 DIAGNOSIS — N390 Urinary tract infection, site not specified: Secondary | ICD-10-CM | POA: Diagnosis not present

## 2020-10-20 ENCOUNTER — Other Ambulatory Visit: Payer: Self-pay | Admitting: Family Medicine

## 2020-10-20 NOTE — Telephone Encounter (Signed)
Pt hasn't been seen since 2020. He has an appt in June 2022?

## 2020-10-21 NOTE — Telephone Encounter (Signed)
Patient has refill so will send in.

## 2020-11-03 DIAGNOSIS — H1830 Unspecified corneal membrane change: Secondary | ICD-10-CM | POA: Diagnosis not present

## 2020-11-03 DIAGNOSIS — I10 Essential (primary) hypertension: Secondary | ICD-10-CM | POA: Diagnosis not present

## 2020-11-07 DIAGNOSIS — R748 Abnormal levels of other serum enzymes: Secondary | ICD-10-CM | POA: Diagnosis not present

## 2020-11-07 DIAGNOSIS — I10 Essential (primary) hypertension: Secondary | ICD-10-CM | POA: Diagnosis not present

## 2020-11-07 DIAGNOSIS — N390 Urinary tract infection, site not specified: Secondary | ICD-10-CM | POA: Diagnosis not present

## 2020-11-16 IMAGING — US US RENAL
1 series · 14 of 25 positions shown · non-contrast
Comparison: Abdominal ultrasound dated October 23, 2011.

CLINICAL DATA: Chronic kidney disease.

EXAM:
RENAL / URINARY TRACT ULTRASOUND COMPLETE

[Series 1: us renal · 14 of 33 slices shown]
[im 1/33]
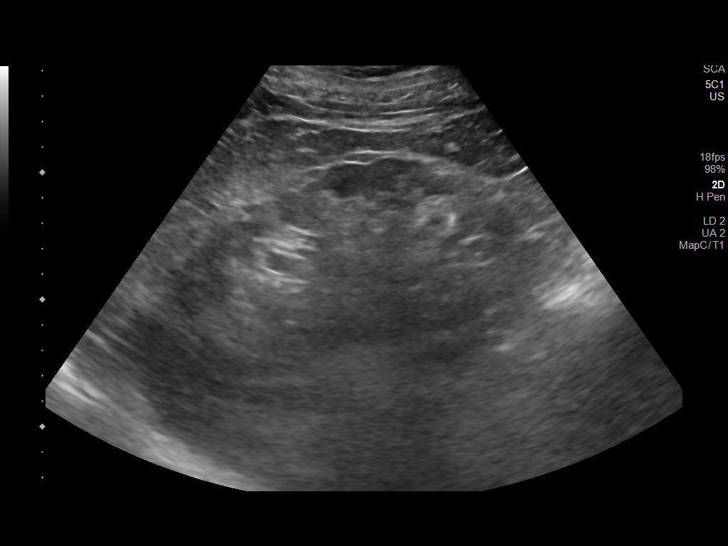
[im 3/33]
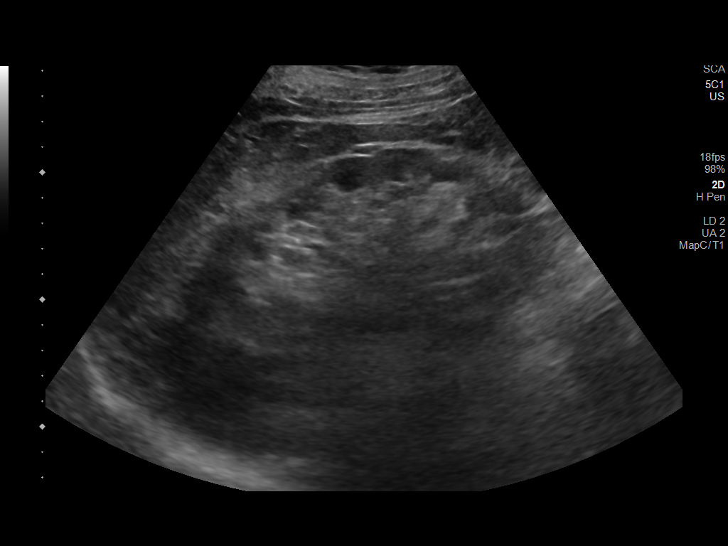
[im 6/33]
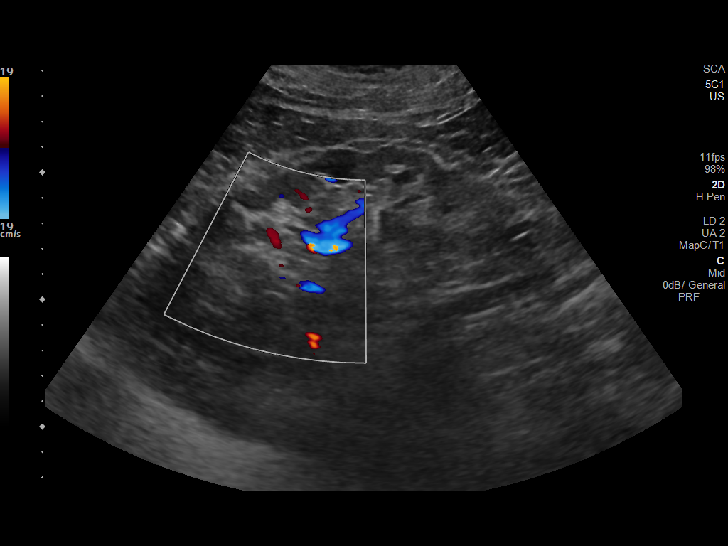
[im 9/33]
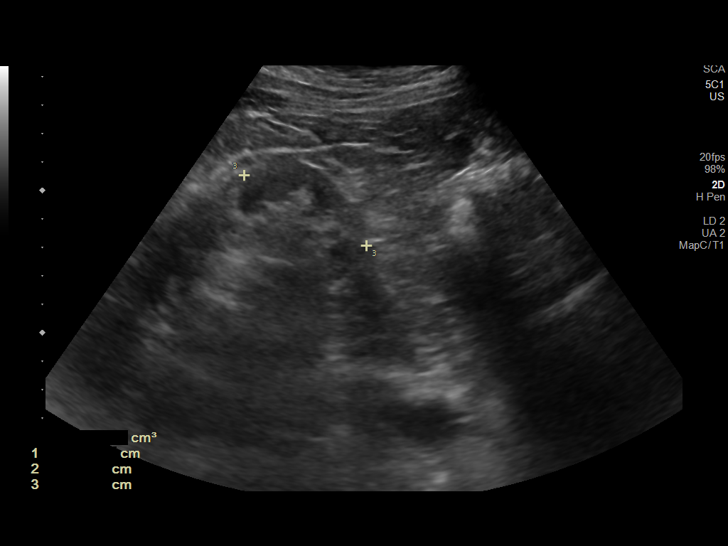
[im 11/33]
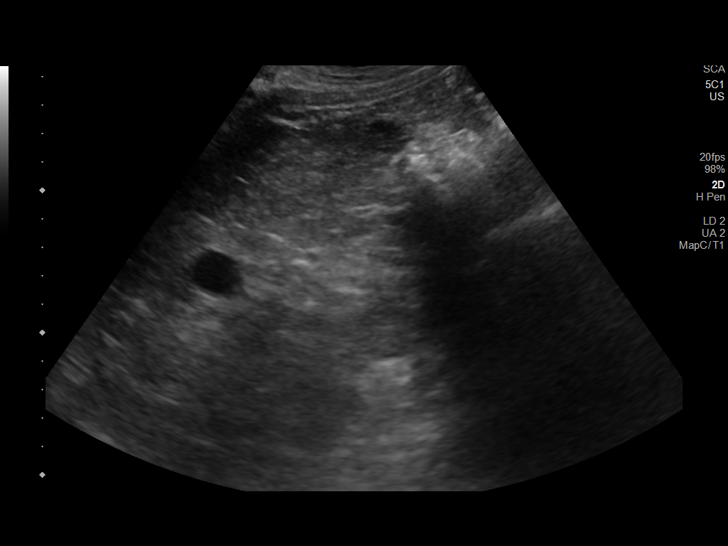
[im 13/33]
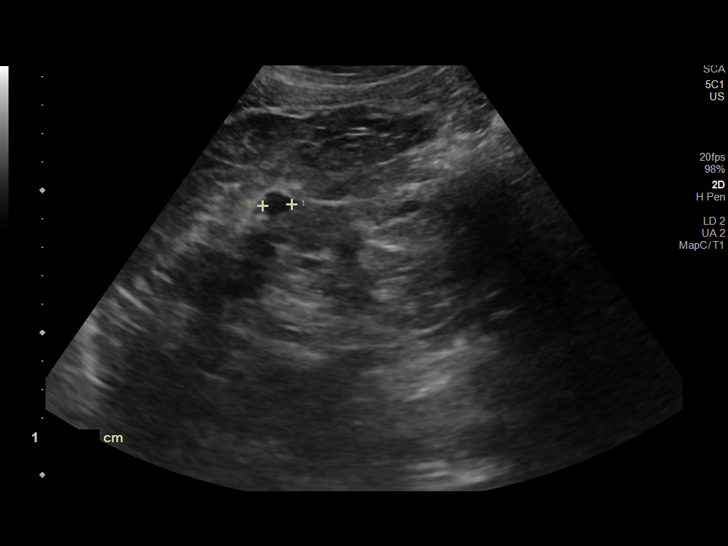
[im 15/33]
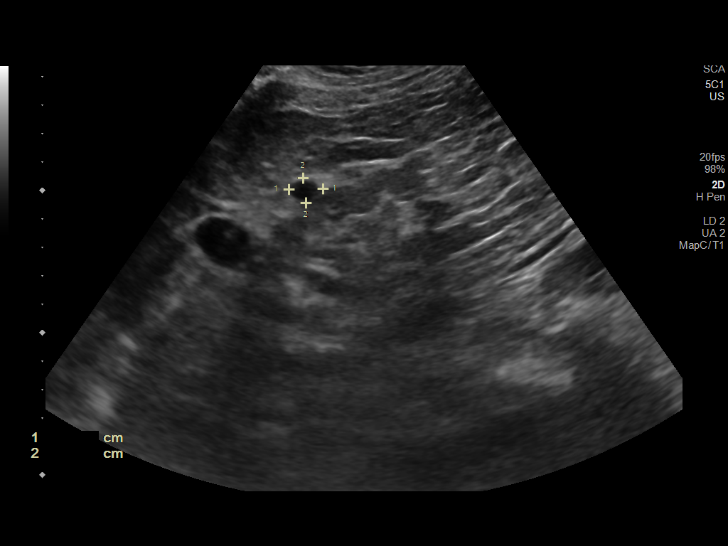
[im 18/33]
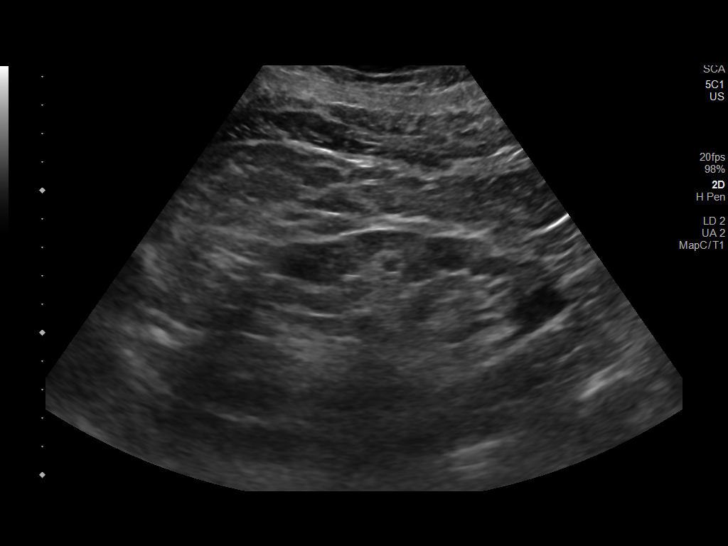
[im 21/33]
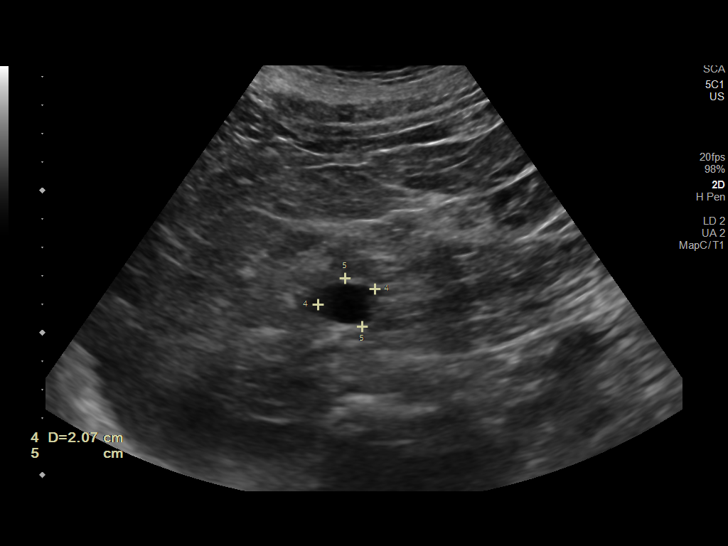
[im 22/33]
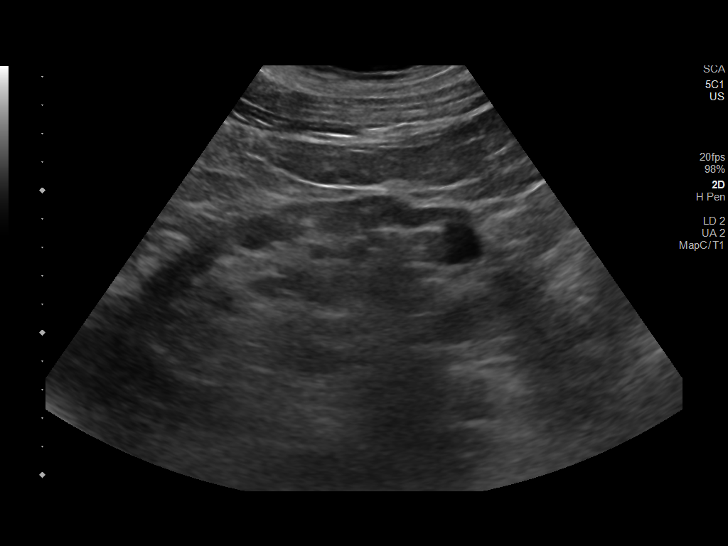
[im 25/33]
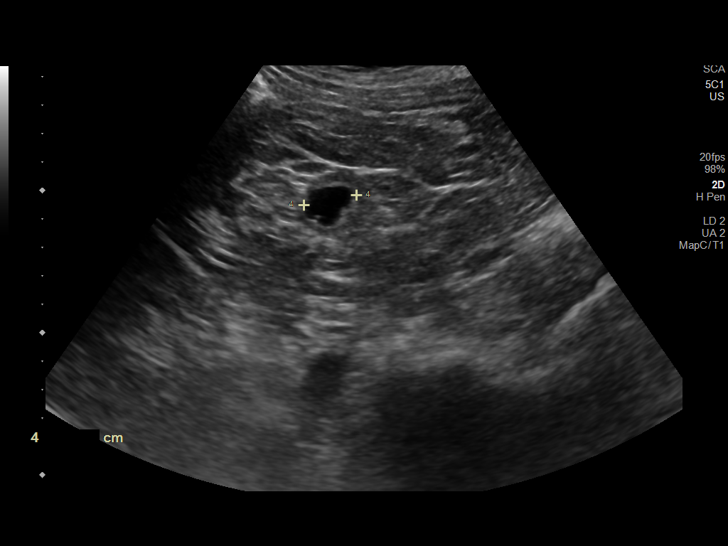
[im 27/33]
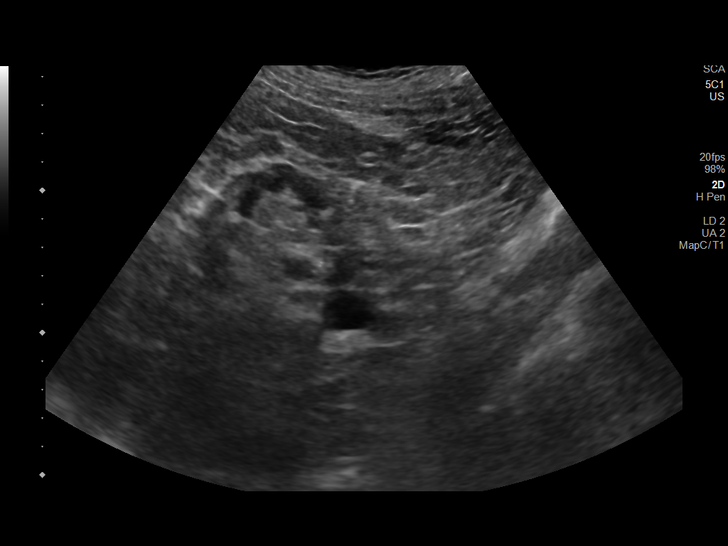
[im 30/33]
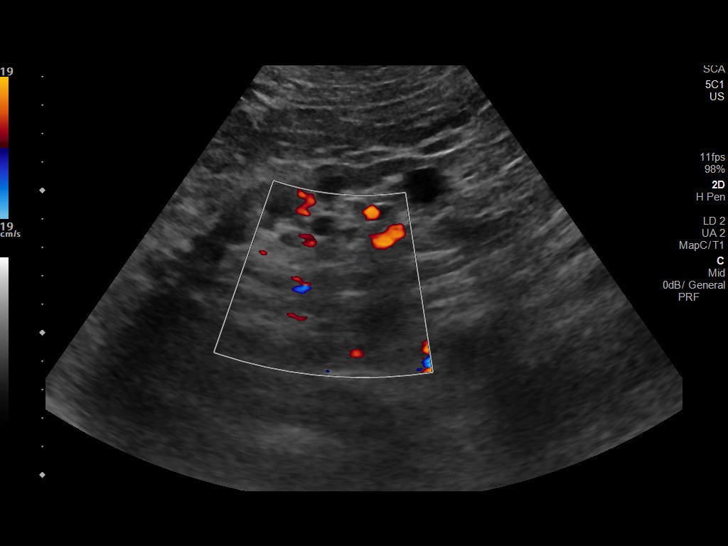
[im 33/33]
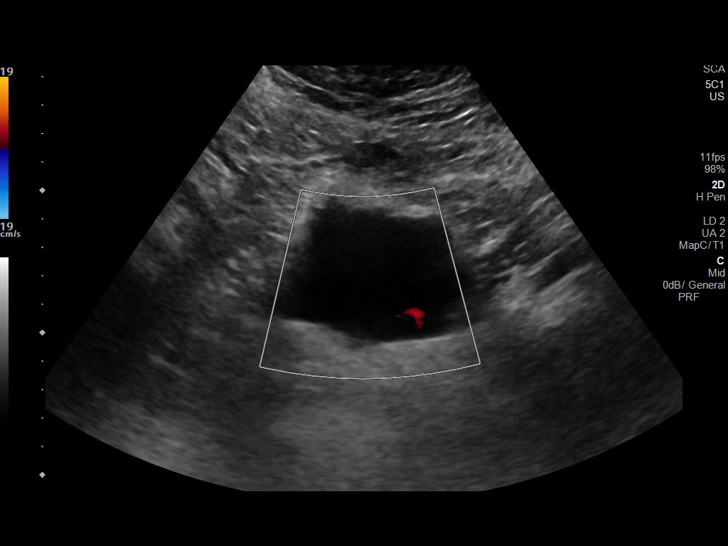

[14 of 25 positions shown; findings below may reference images not displayed]

FINDINGS: Right Kidney:

Renal measurements: 13.0 x 5.5 x 5.0 cm = volume: 185 mL.
Echogenicity within normal limits. Mild cortical thinning. Two
simple cysts measuring up to 1.9 cm. No mass or hydronephrosis
visualized.

Left Kidney:

Renal measurements: 12.9 x 4.6 x 5.7 cm = volume: 178 mL.
Echogenicity within normal limits. Mild cortical thinning. Two
simple cysts measuring up to 2.1 cm. No mass or hydronephrosis
visualized.

Bladder:

Appears normal for degree of bladder distention.

Other:

None.
IMPRESSION: 1. No acute abnormality.
2. Bilateral renal cortical thinning and simple cysts.

## 2020-12-08 DIAGNOSIS — M1830 Unilateral post-traumatic osteoarthritis of first carpometacarpal joint, unspecified hand: Secondary | ICD-10-CM | POA: Diagnosis not present

## 2020-12-08 DIAGNOSIS — I10 Essential (primary) hypertension: Secondary | ICD-10-CM | POA: Diagnosis not present

## 2020-12-12 DIAGNOSIS — I10 Essential (primary) hypertension: Secondary | ICD-10-CM | POA: Diagnosis not present

## 2020-12-12 DIAGNOSIS — N390 Urinary tract infection, site not specified: Secondary | ICD-10-CM | POA: Diagnosis not present

## 2020-12-12 DIAGNOSIS — R748 Abnormal levels of other serum enzymes: Secondary | ICD-10-CM | POA: Diagnosis not present

## 2021-01-23 DIAGNOSIS — E213 Hyperparathyroidism, unspecified: Secondary | ICD-10-CM | POA: Diagnosis not present

## 2021-01-23 DIAGNOSIS — I10 Essential (primary) hypertension: Secondary | ICD-10-CM | POA: Diagnosis not present

## 2021-01-23 DIAGNOSIS — N183 Chronic kidney disease, stage 3 unspecified: Secondary | ICD-10-CM | POA: Diagnosis not present

## 2021-02-12 ENCOUNTER — Other Ambulatory Visit: Payer: Self-pay | Admitting: Family Medicine

## 2021-02-17 DIAGNOSIS — R748 Abnormal levels of other serum enzymes: Secondary | ICD-10-CM | POA: Diagnosis not present

## 2021-03-16 DIAGNOSIS — M25561 Pain in right knee: Secondary | ICD-10-CM | POA: Diagnosis not present

## 2021-03-23 DIAGNOSIS — I10 Essential (primary) hypertension: Secondary | ICD-10-CM | POA: Diagnosis not present

## 2021-03-23 DIAGNOSIS — N183 Chronic kidney disease, stage 3 unspecified: Secondary | ICD-10-CM | POA: Diagnosis not present

## 2021-03-28 ENCOUNTER — Encounter: Payer: Self-pay | Admitting: Family Medicine

## 2021-03-28 ENCOUNTER — Other Ambulatory Visit: Payer: Self-pay

## 2021-03-28 ENCOUNTER — Ambulatory Visit (INDEPENDENT_AMBULATORY_CARE_PROVIDER_SITE_OTHER): Payer: Medicare Other | Admitting: Family Medicine

## 2021-03-28 VITALS — BP 110/64 | HR 59 | Temp 98.2°F | Resp 16 | Ht 68.0 in | Wt 195.8 lb

## 2021-03-28 DIAGNOSIS — R7309 Other abnormal glucose: Secondary | ICD-10-CM

## 2021-03-28 DIAGNOSIS — E119 Type 2 diabetes mellitus without complications: Secondary | ICD-10-CM | POA: Diagnosis not present

## 2021-03-28 DIAGNOSIS — N1832 Chronic kidney disease, stage 3b: Secondary | ICD-10-CM | POA: Diagnosis not present

## 2021-03-28 DIAGNOSIS — Z1211 Encounter for screening for malignant neoplasm of colon: Secondary | ICD-10-CM | POA: Diagnosis not present

## 2021-03-28 DIAGNOSIS — G63 Polyneuropathy in diseases classified elsewhere: Secondary | ICD-10-CM | POA: Diagnosis not present

## 2021-03-28 DIAGNOSIS — R351 Nocturia: Secondary | ICD-10-CM | POA: Diagnosis not present

## 2021-03-28 DIAGNOSIS — I10 Essential (primary) hypertension: Secondary | ICD-10-CM

## 2021-03-28 DIAGNOSIS — N390 Urinary tract infection, site not specified: Secondary | ICD-10-CM | POA: Diagnosis not present

## 2021-03-28 DIAGNOSIS — E782 Mixed hyperlipidemia: Secondary | ICD-10-CM

## 2021-03-28 DIAGNOSIS — R972 Elevated prostate specific antigen [PSA]: Secondary | ICD-10-CM

## 2021-03-28 DIAGNOSIS — Z23 Encounter for immunization: Secondary | ICD-10-CM | POA: Diagnosis not present

## 2021-03-28 DIAGNOSIS — R748 Abnormal levels of other serum enzymes: Secondary | ICD-10-CM | POA: Diagnosis not present

## 2021-03-28 DIAGNOSIS — Z0000 Encounter for general adult medical examination without abnormal findings: Secondary | ICD-10-CM

## 2021-03-28 LAB — PSA: PSA: 5.21 ng/mL — ABNORMAL HIGH (ref 0.10–4.00)

## 2021-03-28 LAB — CBC WITH DIFFERENTIAL/PLATELET
Basophils Absolute: 0 10*3/uL (ref 0.0–0.1)
Basophils Relative: 0.6 % (ref 0.0–3.0)
Eosinophils Absolute: 0 10*3/uL (ref 0.0–0.7)
Eosinophils Relative: 0.8 % (ref 0.0–5.0)
HCT: 42.2 % (ref 39.0–52.0)
Hemoglobin: 14.1 g/dL (ref 13.0–17.0)
Lymphocytes Relative: 32.9 % (ref 12.0–46.0)
Lymphs Abs: 2 10*3/uL (ref 0.7–4.0)
MCHC: 33.3 g/dL (ref 30.0–36.0)
MCV: 93.8 fl (ref 78.0–100.0)
Monocytes Absolute: 1.1 10*3/uL — ABNORMAL HIGH (ref 0.1–1.0)
Monocytes Relative: 17.4 % — ABNORMAL HIGH (ref 3.0–12.0)
Neutro Abs: 3 10*3/uL (ref 1.4–7.7)
Neutrophils Relative %: 48.3 % (ref 43.0–77.0)
Platelets: 147 10*3/uL — ABNORMAL LOW (ref 150.0–400.0)
RBC: 4.5 Mil/uL (ref 4.22–5.81)
RDW: 14.7 % (ref 11.5–15.5)
WBC: 6.2 10*3/uL (ref 4.0–10.5)

## 2021-03-28 LAB — TSH: TSH: 1.98 u[IU]/mL (ref 0.35–4.50)

## 2021-03-28 LAB — HEMOGLOBIN A1C: Hgb A1c MFr Bld: 5.9 % (ref 4.6–6.5)

## 2021-03-28 NOTE — Patient Instructions (Addendum)
Shingrix is the new shingles shot, 2 shots over 2-6 months, confirm coverage with insurance and document, then can return here for shots with nurse appt or at pharmacy   Dermatology  Preventive Care 79 Years and Older, Male Preventive care refers to lifestyle choices and visits with your health care provider that can promote health and wellness. This includes: A yearly physical exam. This is also called an annual wellness visit. Regular dental and eye exams. Immunizations. Screening for certain conditions. Healthy lifestyle choices, such as: Eating a healthy diet. Getting regular exercise. Not using drugs or products that contain nicotine and tobacco. Limiting alcohol use. What can I expect for my preventive care visit? Physical exam Your health care provider will check your: Height and weight. These may be used to calculate your BMI (body mass index). BMI is a measurement that tells if you are at a healthy weight. Heart rate and blood pressure. Body temperature. Skin for abnormal spots. Counseling Your health care provider may ask you questions about your: Past medical problems. Family's medical history. Alcohol, tobacco, and drug use. Emotional well-being. Home life and relationship well-being. Sexual activity. Diet, exercise, and sleep habits. History of falls. Memory and ability to understand (cognition). Work and work Statistician. Access to firearms. What immunizations do I need?  Vaccines are usually given at various ages, according to a schedule. Your health care provider will recommend vaccines for you based on your age, medicalhistory, and lifestyle or other factors, such as travel or where you work. What tests do I need? Blood tests Lipid and cholesterol levels. These may be checked every 5 years, or more often depending on your overall health. Hepatitis C test. Hepatitis B test. Screening Lung cancer screening. You may have this screening every year starting at  age 79 if you have a 30-pack-year history of smoking and currently smoke or have quit within the past 15 years. Colorectal cancer screening. All adults should have this screening starting at age 79 and continuing until age 8. Your health care provider may recommend screening at age 79 if you are at increased risk. You will have tests every 1-10 years, depending on your results and the type of screening test. Prostate cancer screening. Recommendations will vary depending on your family history and other risks. Genital exam to check for testicular cancer or hernias. Diabetes screening. This is done by checking your blood sugar (glucose) after you have not eaten for a while (fasting). You may have this done every 1-3 years. Abdominal aortic aneurysm (AAA) screening. You may need this if you are a current or former smoker. STD (sexually transmitted disease) testing, if you are at risk. Follow these instructions at home: Eating and drinking  Eat a diet that includes fresh fruits and vegetables, whole grains, lean protein, and low-fat dairy products. Limit your intake of foods with high amounts of sugar, saturated fats, and salt. Take vitamin and mineral supplements as recommended by your health care provider. Do not drink alcohol if your health care provider tells you not to drink. If you drink alcohol: Limit how much you have to 0-2 drinks a day. Be aware of how much alcohol is in your drink. In the [place] one drink equals one 12 oz bottle of beer (355 mL), one 5 oz glass of wine (148 mL), or one 1 oz glass of hard liquor (44 mL).  Lifestyle Take daily care of your teeth and gums. Brush your teeth every morning and night with fluoride toothpaste. Floss one time  each day. Stay active. Exercise for at least 30 minutes 5 or more days each week. Do not use any products that contain nicotine or tobacco, such as cigarettes, e-cigarettes, and chewing tobacco. If you need help quitting, ask your health  care provider. Do not use drugs. If you are sexually active, practice safe sex. Use a condom or other form of protection to prevent STIs (sexually transmitted infections). Talk with your health care provider about taking a low-dose aspirin or statin. Find healthy ways to cope with stress, such as: Meditation, yoga, or listening to music. Journaling. Talking to a trusted person. Spending time with friends and family. Safety Always wear your seat belt while driving or riding in a vehicle. Do not drive: If you have been drinking alcohol. Do not ride with someone who has been drinking. When you are tired or distracted. While texting. Wear a helmet and other protective equipment during sports activities. If you have firearms in your house, make sure you follow all gun safety procedures. What's next? Visit your health care provider once a year for an annual wellness visit. Ask your health care provider how often you should have your eyes and teeth checked. Stay up to date on all vaccines. This information is not intended to replace advice given to you by your health care provider. Make sure you discuss any questions you have with your healthcare provider. Document Revised: 06/16/2019 Document Reviewed: 09/11/2018 Elsevier Patient Education  2022 [employer].
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
## 2021-03-29 ENCOUNTER — Other Ambulatory Visit: Payer: Self-pay | Admitting: Family Medicine

## 2021-03-29 DIAGNOSIS — R972 Elevated prostate specific antigen [PSA]: Secondary | ICD-10-CM

## 2021-03-29 LAB — URINE CULTURE
MICRO NUMBER:: 12059252
SPECIMEN QUALITY:: ADEQUATE

## 2021-03-29 LAB — LIPID PANEL
Cholesterol: 176 mg/dL (ref 0–200)
HDL: 41.7 mg/dL (ref >39.00)
LDL Cholesterol: 107 mg/dL — ABNORMAL HIGH (ref 0–99)
NonHDL: 134.23
Total CHOL/HDL Ratio: 4
Triglycerides: 136 mg/dL (ref 0.0–149.0)
VLDL: 27.2 mg/dL (ref 0.0–40.0)

## 2021-03-29 LAB — URINALYSIS
Bilirubin Urine: NEGATIVE
Hgb urine dipstick: NEGATIVE
Ketones, ur: NEGATIVE
Leukocytes,Ua: NEGATIVE
Nitrite: NEGATIVE
Specific Gravity, Urine: 1.02 (ref 1.000–1.030)
Total Protein, Urine: NEGATIVE
Urine Glucose: >=1000 — AB
Urobilinogen, UA: 0.2 (ref 0.0–1.0)
pH: 5.5 (ref 5.0–8.0)

## 2021-03-29 LAB — COMPREHENSIVE METABOLIC PANEL
ALT: 20 U/L (ref 0–53)
AST: 22 U/L (ref 0–37)
Albumin: 4.7 g/dL (ref 3.5–5.2)
Alkaline Phosphatase: 72 U/L (ref 39–117)
BUN: 36 mg/dL — ABNORMAL HIGH (ref 6–23)
CO2: 25 mEq/L (ref 19–32)
Calcium: 10.1 mg/dL (ref 8.4–10.5)
Chloride: 106 mEq/L (ref 96–112)
Creatinine, Ser: 2.17 mg/dL — ABNORMAL HIGH (ref 0.40–1.50)
GFR: 28.37 mL/min — ABNORMAL LOW (ref >60.00)
Glucose, Bld: 112 mg/dL — ABNORMAL HIGH (ref 70–99)
Potassium: 5.2 mEq/L — ABNORMAL HIGH (ref 3.5–5.1)
Sodium: 142 mEq/L (ref 135–145)
Total Bilirubin: 1 mg/dL (ref 0.2–1.2)
Total Protein: 7.2 g/dL (ref 6.0–8.3)

## 2021-03-31 ENCOUNTER — Other Ambulatory Visit (INDEPENDENT_AMBULATORY_CARE_PROVIDER_SITE_OTHER): Payer: Medicare Other

## 2021-03-31 ENCOUNTER — Other Ambulatory Visit: Payer: Self-pay

## 2021-03-31 DIAGNOSIS — Z1211 Encounter for screening for malignant neoplasm of colon: Secondary | ICD-10-CM | POA: Diagnosis not present

## 2021-03-31 LAB — FECAL OCCULT BLOOD, IMMUNOCHEMICAL: Fecal Occult Bld: NEGATIVE

## 2021-03-31 NOTE — Progress Notes (Signed)
Per the orders of [name] pt is dropping off sample.

## 2021-04-03 DIAGNOSIS — R972 Elevated prostate specific antigen [PSA]: Secondary | ICD-10-CM | POA: Insufficient documentation

## 2021-04-03 NOTE — Assessment & Plan Note (Signed)
Patient encouraged to maintain heart healthy diet, regular exercise, adequate sleep. Consider daily probiotics. Take medications as prescribed. He agrees to [facility], labs ordered and reviewed. Shingrix

## 2021-04-03 NOTE — Assessment & Plan Note (Signed)
Control risk factors and report if any pain develops

## 2021-04-03 NOTE — Assessment & Plan Note (Signed)
Well controlled, no changes to meds. Encouraged heart healthy diet such as the DASH diet and exercise as tolerated.  °

## 2021-04-03 NOTE — Assessment & Plan Note (Signed)
Following with nephrology

## 2021-04-03 NOTE — Assessment & Plan Note (Addendum)
encouraged heart healthy diet, avoid trans fats, minimize simple carbs and saturated fats. Increase exercise as tolerated 

## 2021-04-03 NOTE — Assessment & Plan Note (Signed)
Referred to urology for further evaluation

## 2021-04-03 NOTE — Assessment & Plan Note (Signed)
hgba1c acceptable, minimize simple carbs. Increase exercise as tolerated.  

## 2021-04-04 DIAGNOSIS — Z0000 Encounter for general adult medical examination without abnormal findings: Secondary | ICD-10-CM | POA: Diagnosis not present

## 2021-04-04 DIAGNOSIS — Z23 Encounter for immunization: Secondary | ICD-10-CM

## 2021-04-04 DIAGNOSIS — G63 Polyneuropathy in diseases classified elsewhere: Secondary | ICD-10-CM | POA: Diagnosis not present

## 2021-04-09 ENCOUNTER — Other Ambulatory Visit: Payer: Self-pay | Admitting: Family Medicine

## 2021-05-04 ENCOUNTER — Other Ambulatory Visit: Payer: Self-pay | Admitting: Family Medicine

## 2021-05-04 DIAGNOSIS — R3915 Urgency of urination: Secondary | ICD-10-CM | POA: Diagnosis not present

## 2021-05-04 DIAGNOSIS — N183 Chronic kidney disease, stage 3 unspecified: Secondary | ICD-10-CM | POA: Diagnosis not present

## 2021-05-04 DIAGNOSIS — R350 Frequency of micturition: Secondary | ICD-10-CM | POA: Diagnosis not present

## 2021-05-04 DIAGNOSIS — R3912 Poor urinary stream: Secondary | ICD-10-CM | POA: Diagnosis not present

## 2021-05-09 DIAGNOSIS — R748 Abnormal levels of other serum enzymes: Secondary | ICD-10-CM | POA: Diagnosis not present

## 2021-05-09 DIAGNOSIS — N390 Urinary tract infection, site not specified: Secondary | ICD-10-CM | POA: Diagnosis not present

## 2021-05-11 ENCOUNTER — Ambulatory Visit: Payer: Medicare Other

## 2021-05-19 ENCOUNTER — Other Ambulatory Visit: Payer: Self-pay | Admitting: Family Medicine

## 2021-05-22 DIAGNOSIS — L218 Other seborrheic dermatitis: Secondary | ICD-10-CM | POA: Diagnosis not present

## 2021-05-22 DIAGNOSIS — D171 Benign lipomatous neoplasm of skin and subcutaneous tissue of trunk: Secondary | ICD-10-CM | POA: Diagnosis not present

## 2021-05-22 DIAGNOSIS — L570 Actinic keratosis: Secondary | ICD-10-CM | POA: Diagnosis not present

## 2021-05-22 DIAGNOSIS — Z859 Personal history of malignant neoplasm, unspecified: Secondary | ICD-10-CM | POA: Diagnosis not present

## 2021-06-01 ENCOUNTER — Other Ambulatory Visit: Payer: Self-pay | Admitting: Family Medicine

## 2021-08-04 DIAGNOSIS — Z961 Presence of intraocular lens: Secondary | ICD-10-CM | POA: Diagnosis not present

## 2021-08-04 DIAGNOSIS — H35342 Macular cyst, hole, or pseudohole, left eye: Secondary | ICD-10-CM | POA: Diagnosis not present

## 2021-08-21 NOTE — Progress Notes (Signed)
GU Location of Tumor / Histology: Prostate Ca  If Prostate Cancer, Gleason Score is (4 + 3) and PSA is (5.21 as of 6/22).  Biopsies  Dr. Gloriann Loan       Past/Anticipated interventions by urology, if any:    Past/Anticipated interventions by medical oncology, if any:   Weight changes, if any:  No, but loss of 25 lbs in last year due to change in diet from kidney illness.  Stage 4 Kidney disease.  IPSS:  20 SHIM:   7  Bowel/Bladder complaints, if any:  No bowel or bladder issues.  Nausea/Vomiting, if any: No  Pain issues, if any:  0/10 scale  SAFETY ISSUES: Prior radiation?  No Pacemaker/ICD?  No Possible current pregnancy?  Male Is the patient on methotrexate? No  Current Complaints / other details:  Need more information on treatment options.

## 2021-08-22 ENCOUNTER — Other Ambulatory Visit: Payer: Self-pay

## 2021-08-22 ENCOUNTER — Ambulatory Visit
Admission: RE | Admit: 2021-08-22 | Discharge: 2021-08-22 | Disposition: A | Discharge status: Home / Self Care | Payer: Medicare Other | Source: Ambulatory Visit | Attending: Radiation Oncology | Admitting: Radiation Oncology

## 2021-08-22 ENCOUNTER — Telehealth: Payer: Self-pay | Admitting: *Deleted

## 2021-08-22 VITALS — BP 148/72 | HR 54 | Temp 97.9°F | Resp 20 | Ht 67.5 in | Wt 191.0 lb

## 2021-08-22 DIAGNOSIS — F17210 Nicotine dependence, cigarettes, uncomplicated: Secondary | ICD-10-CM | POA: Diagnosis not present

## 2021-08-22 DIAGNOSIS — E119 Type 2 diabetes mellitus without complications: Secondary | ICD-10-CM | POA: Diagnosis not present

## 2021-08-22 DIAGNOSIS — M129 Arthropathy, unspecified: Secondary | ICD-10-CM | POA: Insufficient documentation

## 2021-08-22 DIAGNOSIS — I10 Essential (primary) hypertension: Secondary | ICD-10-CM | POA: Diagnosis not present

## 2021-08-22 DIAGNOSIS — Z86010 Personal history of colonic polyps: Secondary | ICD-10-CM | POA: Diagnosis not present

## 2021-08-22 DIAGNOSIS — Z79899 Other long term (current) drug therapy: Secondary | ICD-10-CM | POA: Diagnosis not present

## 2021-08-22 DIAGNOSIS — C61 Malignant neoplasm of prostate: Secondary | ICD-10-CM | POA: Insufficient documentation

## 2021-08-22 DIAGNOSIS — E785 Hyperlipidemia, unspecified: Secondary | ICD-10-CM | POA: Diagnosis not present

## 2021-08-22 DIAGNOSIS — Z7982 Long term (current) use of aspirin: Secondary | ICD-10-CM | POA: Insufficient documentation

## 2021-08-22 DIAGNOSIS — G629 Polyneuropathy, unspecified: Secondary | ICD-10-CM | POA: Diagnosis not present

## 2021-08-22 NOTE — Progress Notes (Signed)
Introduced myself to patient as the prostate nurse navigator and discussed my role.  No barriers to care identified at this time.  He is here to discuss his radiation treatment options.  I gave him my business card and asked him to call me with questions or concerns.  Verbalized understanding.   

## 2021-08-22 NOTE — Telephone Encounter (Signed)
RETURNED PATIENT'S PHONE CALL, SPOKE WITH PATIENT. ?
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
## 2021-08-23 ENCOUNTER — Telehealth: Payer: Self-pay | Admitting: *Deleted

## 2021-08-23 NOTE — Telephone Encounter (Signed)
CALLED PATIENT TO INFORM OF PRE-SEED APPTS. FOR 10-12-21 AND HIS IMPLANT ON 11-17-21, SPOKE WITH PATIENT AND HE IS AWARE OF THESE APPTS.

## 2021-08-29 ENCOUNTER — Ambulatory Visit: Payer: Medicare Other | Admitting: Radiation Oncology

## 2021-08-29 ENCOUNTER — Ambulatory Visit: Payer: Medicare Other

## 2021-09-15 ENCOUNTER — Other Ambulatory Visit: Payer: Self-pay | Admitting: Family Medicine

## 2021-10-11 ENCOUNTER — Telehealth: Payer: Self-pay | Admitting: *Deleted

## 2021-10-11 NOTE — Telephone Encounter (Signed)
CALLED PATIENT TO REMIND OF PRE-SEED APPTS. FOR 08-02-22, LVM FOR A RETURN CALL 

## 2021-10-11 NOTE — Progress Notes (Signed)
°  Radiation Oncology         (336) [phone] ________________________________  Name: Edward Hernandez MRN: [id number]  Date: 10/12/2021  DOB: [birth year]  SIMULATION AND TREATMENT PLANNING NOTE PUBIC ARCH STUDY  ON:GEXBM, Bonnita Levan, MD  Lucas Mallow, MD  DIAGNOSIS: 81 y.o. gentleman with Stage T2a adenocarcinoma of the prostate with Gleason score of 4+3, and PSA of 5.21.  Oncology History  Malignant neoplasm of prostate ([place])  08/08/2021 Cancer Staging   Staging form: Prostate, AJCC 8th Edition - Clinical stage from 08/08/2021: Stage IIC (cT2a, cN0, cM0, PSA: 5.2, Grade Group: 3) - Signed by Freeman Caldron, PA-C on 08/22/2021 Histopathologic type: Adenocarcinoma, NOS Stage prefix: Initial diagnosis Prostate specific antigen (PSA) range: Less than 10 Gleason primary pattern: 4 Gleason secondary pattern: 3 Gleason score: 7 Histologic grading system: 5 grade system Number of biopsy cores examined: 12 Number of biopsy cores positive: 6 Location of positive needle core biopsies: Both sides    08/22/2021 Initial Diagnosis   Malignant neoplasm of prostate ([place])       ICD-10-CM   1. Malignant neoplasm of prostate ([place])  C61       COMPLEX SIMULATION:  The patient presented today for evaluation for possible prostate seed implant. He was brought to the radiation planning suite and placed supine on the CT couch. A 3-dimensional image study set was obtained in upload to the planning computer. There, on each axial slice, I contoured the prostate gland. Then, using three-dimensional radiation planning tools I reconstructed the prostate in view of the structures from the transperineal needle pathway to assess for possible pubic arch interference. In doing so, I did not appreciate any pubic arch interference. Also, the patient's prostate volume was estimated based on the drawn structure. The volume was 35.7 cc.  Given the pubic arch appearance and prostate volume, patient remains a good candidate  to proceed with prostate seed implant. Today, he freely provided informed written consent to proceed.    PLAN: The patient will undergo prostate seed implant.   ________________________________  Sheral Apley. Tammi Klippel, M.D.

## 2021-10-12 ENCOUNTER — Ambulatory Visit
Admission: RE | Admit: 2021-10-12 | Discharge: 2021-10-12 | Disposition: A | Discharge status: Home / Self Care | Payer: Medicare Other | Source: Ambulatory Visit | Attending: Radiation Oncology | Admitting: Radiation Oncology

## 2021-10-12 ENCOUNTER — Encounter: Payer: Self-pay | Admitting: Urology

## 2021-10-12 ENCOUNTER — Encounter (HOSPITAL_COMMUNITY)
Admission: RE | Admit: 2021-10-12 | Discharge: 2021-10-12 | Disposition: A | Discharge status: Home / Self Care | Payer: Medicare Other | Source: Ambulatory Visit | Attending: Urology | Admitting: Urology

## 2021-10-12 ENCOUNTER — Ambulatory Visit
Admission: RE | Admit: 2021-10-12 | Discharge: 2021-10-12 | Disposition: A | Discharge status: Home / Self Care | Payer: Medicare Other | Source: Ambulatory Visit | Attending: Urology | Admitting: Urology

## 2021-10-12 ENCOUNTER — Other Ambulatory Visit: Payer: Self-pay

## 2021-10-12 VITALS — Ht 68.0 in | Wt 191.0 lb

## 2021-10-12 DIAGNOSIS — C61 Malignant neoplasm of prostate: Secondary | ICD-10-CM

## 2021-10-12 DIAGNOSIS — Z01810 Encounter for preprocedural cardiovascular examination: Secondary | ICD-10-CM | POA: Insufficient documentation

## 2021-10-12 NOTE — Progress Notes (Signed)
Verified patient identity and begin nursing interview. Patient states "Doing well. No symptoms to report at this time."  Meaningful use complete. Currently on Flomax 0.4mg  as directed. Urology follow-up w/ [facility]- 11/07/21  Ht 5\' 8"  (1.727 m)    Wt 191 lb (86.6 kg)    BMI 29.04 kg/m

## 2021-10-27 DIAGNOSIS — N183 Chronic kidney disease, stage 3 unspecified: Secondary | ICD-10-CM | POA: Diagnosis not present

## 2021-10-27 DIAGNOSIS — I10 Essential (primary) hypertension: Secondary | ICD-10-CM | POA: Diagnosis not present

## 2021-10-31 DIAGNOSIS — N183 Chronic kidney disease, stage 3 unspecified: Secondary | ICD-10-CM | POA: Diagnosis not present

## 2021-10-31 DIAGNOSIS — E119 Type 2 diabetes mellitus without complications: Secondary | ICD-10-CM | POA: Diagnosis not present

## 2021-10-31 DIAGNOSIS — I10 Essential (primary) hypertension: Secondary | ICD-10-CM | POA: Diagnosis not present

## 2021-10-31 DIAGNOSIS — N390 Urinary tract infection, site not specified: Secondary | ICD-10-CM | POA: Diagnosis not present

## 2021-11-09 ENCOUNTER — Other Ambulatory Visit: Payer: Self-pay

## 2021-11-09 ENCOUNTER — Encounter (HOSPITAL_BASED_OUTPATIENT_CLINIC_OR_DEPARTMENT_OTHER): Payer: Self-pay | Admitting: Urology

## 2021-11-09 ENCOUNTER — Other Ambulatory Visit: Payer: Self-pay | Admitting: Urology

## 2021-11-09 NOTE — Progress Notes (Addendum)
Spoke w/ via phone for pre-op interview---pt Lab needs dos----    I stat           Lab results------ekg -10-12-2021 chart/epic COVID test -----cold symptoms , had negative covid test 11-06-2021, needs covid test 11-14-2021 Arrive at -------1100 am 11-17-2021 NPO after MN NO Solid Food.  Clear liquids from MN until---1000 am Med rec completed Medications to take morning of surgery ----- Amlodipine, Labetalol,  Diabetic medication -----none day of surgery Patient instructed no nail polish to be worn day of surgery Patient instructed to bring photo id and insurance card day of surgery Patient aware to have Driver (ride ) / caregiver    for 24 hours after surgery  wife ingrid Rautio Patient Special Instructions -----fleets enema day of surgery Pre-Op special Istructions -----none Patient verbalized understanding of instructions that were given at this phone interview. Patient denies shortness of breath, chest pain, fever, cough at this phone interview.   Lov Cathle[facility] 03-16-2022 Nephrology dr  Jaynie Collins 05-09-2021 epic

## 2021-11-14 ENCOUNTER — Other Ambulatory Visit: Payer: Self-pay | Admitting: Urology

## 2021-11-14 LAB — SARS CORONAVIRUS 2 (TAT 6-24 HRS): SARS Coronavirus 2: NEGATIVE

## 2021-11-16 ENCOUNTER — Telehealth: Payer: Self-pay | Admitting: *Deleted

## 2021-11-16 NOTE — Telephone Encounter (Signed)
Called patient to remind of procedure for 11-17-21, spoke with patient and he is aware of this procedure

## 2021-11-17 ENCOUNTER — Ambulatory Visit (HOSPITAL_BASED_OUTPATIENT_CLINIC_OR_DEPARTMENT_OTHER)
Admission: RE | Admit: 2021-11-17 | Discharge: 2021-11-17 | Disposition: A | Discharge status: Home / Self Care | Payer: Medicare Other | Attending: Urology | Admitting: Urology

## 2021-11-17 ENCOUNTER — Encounter (HOSPITAL_BASED_OUTPATIENT_CLINIC_OR_DEPARTMENT_OTHER)
Admission: RE | Disposition: A | Discharge status: Home / Self Care | Payer: Self-pay | Source: Home / Self Care | Attending: Urology

## 2021-11-17 ENCOUNTER — Encounter (HOSPITAL_BASED_OUTPATIENT_CLINIC_OR_DEPARTMENT_OTHER): Payer: Self-pay | Admitting: Urology

## 2021-11-17 ENCOUNTER — Ambulatory Visit (HOSPITAL_COMMUNITY): Payer: Medicare Other

## 2021-11-17 ENCOUNTER — Ambulatory Visit (HOSPITAL_BASED_OUTPATIENT_CLINIC_OR_DEPARTMENT_OTHER): Payer: Medicare Other | Admitting: Anesthesiology

## 2021-11-17 ENCOUNTER — Other Ambulatory Visit: Payer: Self-pay

## 2021-11-17 DIAGNOSIS — Z7984 Long term (current) use of oral hypoglycemic drugs: Secondary | ICD-10-CM | POA: Insufficient documentation

## 2021-11-17 DIAGNOSIS — E119 Type 2 diabetes mellitus without complications: Secondary | ICD-10-CM

## 2021-11-17 DIAGNOSIS — F17290 Nicotine dependence, other tobacco product, uncomplicated: Secondary | ICD-10-CM | POA: Diagnosis not present

## 2021-11-17 DIAGNOSIS — M069 Rheumatoid arthritis, unspecified: Secondary | ICD-10-CM | POA: Insufficient documentation

## 2021-11-17 DIAGNOSIS — I10 Essential (primary) hypertension: Secondary | ICD-10-CM | POA: Insufficient documentation

## 2021-11-17 DIAGNOSIS — C61 Malignant neoplasm of prostate: Secondary | ICD-10-CM | POA: Diagnosis not present

## 2021-11-17 DIAGNOSIS — Z79899 Other long term (current) drug therapy: Secondary | ICD-10-CM | POA: Diagnosis not present

## 2021-11-17 HISTORY — DX: Personal history of adenomatous and serrated colon polyps: Z86.0101

## 2021-11-17 HISTORY — PX: PROSTATE SURGERY: SHX751

## 2021-11-17 HISTORY — DX: Personal history of colonic polyps: Z86.010

## 2021-11-17 HISTORY — DX: Abnormal electrocardiogram (ECG) (EKG): R94.31

## 2021-11-17 HISTORY — DX: Unspecified osteoarthritis, unspecified site: M19.90

## 2021-11-17 HISTORY — DX: Hyperlipidemia, unspecified: E78.5

## 2021-11-17 HISTORY — DX: Type 2 diabetes mellitus without complications: E11.9

## 2021-11-17 HISTORY — DX: Acute nasopharyngitis (common cold): J00

## 2021-11-17 HISTORY — DX: Malignant neoplasm of prostate: C61

## 2021-11-17 HISTORY — PX: RADIOACTIVE SEED IMPLANT: SHX5150

## 2021-11-17 HISTORY — DX: Chronic kidney disease, unspecified: N18.9

## 2021-11-17 HISTORY — PX: SPACE OAR INSTILLATION: SHX6769

## 2021-11-17 LAB — POCT I-STAT, CHEM 8
BUN: 30 mg/dL — ABNORMAL HIGH (ref 8–23)
Calcium, Ion: 1.33 mmol/L (ref 1.15–1.40)
Chloride: 107 mmol/L (ref 98–111)
Creatinine, Ser: 1.8 mg/dL — ABNORMAL HIGH (ref 0.61–1.24)
Glucose, Bld: 103 mg/dL — ABNORMAL HIGH (ref 70–99)
HCT: 41 % (ref 39.0–52.0)
Hemoglobin: 13.9 g/dL (ref 13.0–17.0)
Potassium: 4.4 mmol/L (ref 3.5–5.1)
Sodium: 141 mmol/L (ref 135–145)
TCO2: 22 mmol/L (ref 22–32)

## 2021-11-17 LAB — GLUCOSE, CAPILLARY: Glucose-Capillary: 105 mg/dL — ABNORMAL HIGH (ref 70–99)

## 2021-11-17 SURGERY — INSERTION, RADIATION SOURCE, PROSTATE
Anesthesia: General | Site: Rectum

## 2021-11-17 MED ORDER — SODIUM CHLORIDE (PF) 0.9 % IJ SOLN
INTRAMUSCULAR | Status: AC
  Filled 2021-11-17: qty 10

## 2021-11-17 MED ORDER — CIPROFLOXACIN IN D5W 400 MG/200ML IV SOLN
INTRAVENOUS | Status: AC
  Filled 2021-11-17: qty 200

## 2021-11-17 MED ORDER — ROCURONIUM BROMIDE 10 MG/ML (PF) SYRINGE
PREFILLED_SYRINGE | INTRAVENOUS | Status: DC | PRN
  Administered 2021-11-17: 60 mg via INTRAVENOUS

## 2021-11-17 MED ORDER — SUGAMMADEX SODIUM 200 MG/2ML IV SOLN
INTRAVENOUS | Status: DC | PRN
Start: 2021-11-17 — End: 2021-11-17
  Administered 2021-11-17: 200 mg via INTRAVENOUS

## 2021-11-17 MED ORDER — FENTANYL CITRATE (PF) 100 MCG/2ML IJ SOLN: 25.0000 ug | INTRAMUSCULAR | Status: DC | PRN

## 2021-11-17 MED ORDER — CIPROFLOXACIN IN D5W 400 MG/200ML IV SOLN
400.0000 mg | INTRAVENOUS | Status: AC
  Administered 2021-11-17: 400 mg via INTRAVENOUS

## 2021-11-17 MED ORDER — CHLORHEXIDINE GLUCONATE 0.12 % MT SOLN: 15.0000 mL | Freq: Once | OROMUCOSAL | Status: DC

## 2021-11-17 MED ORDER — SODIUM CHLORIDE (PF) 0.9 % IJ SOLN
INTRAMUSCULAR | Status: DC | PRN
  Administered 2021-11-17: 3 mL

## 2021-11-17 MED ORDER — FLEET ENEMA 7-19 GM/118ML RE ENEM: 1.0000 | ENEMA | Freq: Once | RECTAL | Status: DC

## 2021-11-17 MED ORDER — PHENYLEPHRINE 40 MCG/ML (10ML) SYRINGE FOR IV PUSH (FOR BLOOD PRESSURE SUPPORT)
PREFILLED_SYRINGE | INTRAVENOUS | Status: AC
  Filled 2021-11-17: qty 10

## 2021-11-17 MED ORDER — LIDOCAINE HCL (PF) 2 % IJ SOLN
INTRAMUSCULAR | Status: AC
  Filled 2021-11-17: qty 5

## 2021-11-17 MED ORDER — ONDANSETRON HCL 4 MG/2ML IJ SOLN
INTRAMUSCULAR | Status: DC | PRN
  Administered 2021-11-17: 4 mg via INTRAVENOUS

## 2021-11-17 MED ORDER — EPHEDRINE SULFATE-NACL 50-0.9 MG/10ML-% IV SOSY
PREFILLED_SYRINGE | INTRAVENOUS | Status: DC | PRN
  Administered 2021-11-17: 10 mg via INTRAVENOUS
  Administered 2021-11-17: 5 mg via INTRAVENOUS
  Administered 2021-11-17: 10 mg via INTRAVENOUS

## 2021-11-17 MED ORDER — ORAL CARE MOUTH RINSE: 15.0000 mL | Freq: Once | OROMUCOSAL | Status: DC

## 2021-11-17 MED ORDER — LIDOCAINE 2% (20 MG/ML) 5 ML SYRINGE
INTRAMUSCULAR | Status: DC | PRN
  Administered 2021-11-17: 60 mg via INTRAVENOUS

## 2021-11-17 MED ORDER — PHENYLEPHRINE 40 MCG/ML (10ML) SYRINGE FOR IV PUSH (FOR BLOOD PRESSURE SUPPORT)
PREFILLED_SYRINGE | INTRAVENOUS | Status: DC | PRN
  Administered 2021-11-17: 80 ug via INTRAVENOUS

## 2021-11-17 MED ORDER — IOHEXOL 300 MG/ML  SOLN
INTRAMUSCULAR | Status: DC | PRN
  Administered 2021-11-17: 7 mL

## 2021-11-17 MED ORDER — ACETAMINOPHEN 500 MG PO TABS
1000.0000 mg | ORAL_TABLET | Freq: Once | ORAL | Status: AC
  Administered 2021-11-17: 1000 mg via ORAL

## 2021-11-17 MED ORDER — LACTATED RINGERS IV SOLN: INTRAVENOUS | Status: DC | PRN

## 2021-11-17 MED ORDER — DEXAMETHASONE SODIUM PHOSPHATE 10 MG/ML IJ SOLN
INTRAMUSCULAR | Status: AC
  Filled 2021-11-17: qty 1

## 2021-11-17 MED ORDER — ONDANSETRON HCL 4 MG/2ML IJ SOLN
INTRAMUSCULAR | Status: AC
  Filled 2021-11-17: qty 2

## 2021-11-17 MED ORDER — PROPOFOL 10 MG/ML IV BOLUS
INTRAVENOUS | Status: DC | PRN
  Administered 2021-11-17: 120 mg via INTRAVENOUS

## 2021-11-17 MED ORDER — ROCURONIUM BROMIDE 10 MG/ML (PF) SYRINGE
PREFILLED_SYRINGE | INTRAVENOUS | Status: AC
  Filled 2021-11-17: qty 10

## 2021-11-17 MED ORDER — FENTANYL CITRATE (PF) 100 MCG/2ML IJ SOLN
INTRAMUSCULAR | Status: DC | PRN
Start: 2021-11-17 — End: 2021-11-17
  Administered 2021-11-17: 50 ug via INTRAVENOUS

## 2021-11-17 MED ORDER — HYDROCODONE-ACETAMINOPHEN 5-325 MG PO TABS: 1.0000 | ORAL_TABLET | ORAL | 0 refills | Status: DC | PRN

## 2021-11-17 MED ORDER — FENTANYL CITRATE (PF) 100 MCG/2ML IJ SOLN
INTRAMUSCULAR | Status: AC
  Filled 2021-11-17: qty 2

## 2021-11-17 MED ORDER — DEXAMETHASONE SODIUM PHOSPHATE 10 MG/ML IJ SOLN
INTRAMUSCULAR | Status: DC | PRN
  Administered 2021-11-17: 4 mg via INTRAVENOUS

## 2021-11-17 MED ORDER — STERILE WATER FOR IRRIGATION IR SOLN
Status: DC | PRN
  Administered 2021-11-17: 500 mL

## 2021-11-17 MED ORDER — SODIUM CHLORIDE 0.9 % IV SOLN: INTRAVENOUS | Status: DC

## 2021-11-17 MED ORDER — ACETAMINOPHEN 500 MG PO TABS
ORAL_TABLET | ORAL | Status: AC
  Filled 2021-11-17: qty 2

## 2021-11-17 SURGICAL SUPPLY — 42 items
BAG DRN RND TRDRP ANRFLXCHMBR (UROLOGICAL SUPPLIES) ×1
BAG URINE DRAIN 2000ML AR STRL (UROLOGICAL SUPPLIES) ×3 IMPLANT
BLADE CLIPPER SENSICLIP SURGIC (BLADE) ×3 IMPLANT
Bard Quick Link Cartridges with Brachysource I-125 ×1 IMPLANT
CATH FOLEY 2WAY SLVR  5CC 16FR (CATHETERS) ×2
CATH FOLEY 2WAY SLVR 5CC 16FR (CATHETERS) ×2 IMPLANT
CATH ROBINSON RED A/P 16FR (CATHETERS) IMPLANT
CATH ROBINSON RED A/P 20FR (CATHETERS) ×3 IMPLANT
CLOTH BEACON ORANGE TIMEOUT ST (SAFETY) ×3 IMPLANT
COVER BACK TABLE 60X90IN (DRAPES) ×3 IMPLANT
COVER MAYO STAND STRL (DRAPES) ×3 IMPLANT
DRSG TEGADERM 4X4.75 (GAUZE/BANDAGES/DRESSINGS) ×6 IMPLANT
DRSG TEGADERM 8X12 (GAUZE/BANDAGES/DRESSINGS) ×6 IMPLANT
GEL ULTRASOUND 20GR AQUASONIC (MISCELLANEOUS) ×3 IMPLANT
GLOVE SURG ENC MOIS LTX SZ6.5 (GLOVE) ×3 IMPLANT
GLOVE SURG ENC MOIS LTX SZ7.5 (GLOVE) ×3 IMPLANT
GLOVE SURG ORTHO LTX SZ8.5 (GLOVE) ×3 IMPLANT
GLOVE SURG UNDER POLY LF SZ6.5 (GLOVE) IMPLANT
GOWN STRL REUS W/TWL LRG LVL3 (GOWN DISPOSABLE) ×3 IMPLANT
GOWN STRL REUS W/TWL XL LVL3 (GOWN DISPOSABLE) ×3 IMPLANT
GRID BRACH TEMP 18GA 2.8X3X.75 (MISCELLANEOUS) ×3 IMPLANT
HOLDER FOLEY CATH W/STRAP (MISCELLANEOUS) IMPLANT
IMPL SPACEOAR VUE SYSTEM (Spacer) IMPLANT
IMPLANT SPACEOAR VUE SYSTEM (Spacer) ×2 IMPLANT
IV NS 1000ML (IV SOLUTION) ×2
IV NS 1000ML BAXH (IV SOLUTION) ×2 IMPLANT
KIT TURNOVER CYSTO (KITS) ×3 IMPLANT
NDL BRACHY 18G 5PK (NEEDLE) ×8 IMPLANT
NDL BRACHY 18G SINGLE (NEEDLE) IMPLANT
NDL PK MORGANSTERN STABILIZ (NEEDLE) ×2 IMPLANT
NEEDLE BRACHY 18G 5PK (NEEDLE) ×8 IMPLANT
NEEDLE BRACHY 18G SINGLE (NEEDLE) IMPLANT
NEEDLE PK MORGANSTERN STABILIZ (NEEDLE) ×2 IMPLANT
PACK CYSTO (CUSTOM PROCEDURE TRAY) ×3 IMPLANT
SHEATH ULTRASOUND LF (SHEATH) IMPLANT
SHEATH ULTRASOUND LTX NONSTRL (SHEATH) IMPLANT
SURGILUBE 2OZ TUBE FLIPTOP (MISCELLANEOUS) ×3 IMPLANT
SUT BONE WAX W31G (SUTURE) IMPLANT
SYR 10ML LL (SYRINGE) ×3 IMPLANT
TOWEL OR 17X26 10 PK STRL BLUE (TOWEL DISPOSABLE) ×3 IMPLANT
UNDERPAD 30X36 HEAVY ABSORB (UNDERPADS AND DIAPERS) ×6 IMPLANT
WATER STERILE IRR 500ML POUR (IV SOLUTION) ×3 IMPLANT

## 2021-11-17 NOTE — Anesthesia Postprocedure Evaluation (Signed)
Anesthesia Post Note  Patient: Edward Hernandez  Procedure(s) Performed: RADIOACTIVE SEED IMPLANT/BRACHYTHERAPY IMPLANT (Rectum) SPACE OAR INSTILLATION (Rectum)     Patient location during evaluation: PACU Anesthesia Type: General Level of consciousness: awake and alert Pain management: pain level controlled Vital Signs Assessment: post-procedure vital signs reviewed and stable Respiratory status: spontaneous breathing, nonlabored ventilation and respiratory function stable Cardiovascular status: blood pressure returned to baseline and stable Postop Assessment: no apparent nausea or vomiting Anesthetic complications: no   No notable events documented.  Last Vitals:  Vitals:   11/17/21 1448 11/17/21 1500  BP:  (!) 163/76  Pulse: (!) 53 (!) 50  Resp: 16 11  Temp:  36.5 C  SpO2: 97% 96%    Last Pain:  Vitals:   11/17/21 1500  TempSrc:   PainSc: 5                  Mickeal Daws,W. EDMOND

## 2021-11-17 NOTE — Transfer of Care (Signed)
Immediate Anesthesia Transfer of Care Note  Patient: Edward Hernandez  Procedure(s) Performed: Procedure(s) (LRB): RADIOACTIVE SEED IMPLANT/BRACHYTHERAPY IMPLANT (N/A) SPACE OAR INSTILLATION (N/A)  Patient Location: PACU  Anesthesia Type: General  Level of Consciousness: awake, oriented, sedated and patient cooperative  Airway & Oxygen Therapy: Patient Spontanous Breathing and Patient connected to face mask oxygen  Post-op Assessment: Report given to PACU RN and Post -op Vital signs reviewed and stable  Post vital signs: Reviewed and stable  Complications: No apparent anesthesia complications Last Vitals:  Vitals Value Taken Time  BP 162/76 11/17/21 1430  Temp    Pulse 59 11/17/21 1435  Resp 19 11/17/21 1435  SpO2 97 % 11/17/21 1435  Vitals shown include unvalidated device data.  Last Pain:  Vitals:   11/17/21 1129  TempSrc: Oral  PainSc: 0-No pain      Patients Stated Pain Goal: 7 (15/86/82 [id number])  Complications: No notable events documented.

## 2021-11-17 NOTE — Op Note (Signed)
PATIENT:  Edward Hernandez  PRE-OPERATIVE DIAGNOSIS:  Adenocarcinoma of the prostate  POST-OPERATIVE DIAGNOSIS:  Same  PROCEDURE:  1. I-125 radioactive seed implantation 2. Cystoscopy  3. Placement of SpaceOAR  SURGEON:  Surgeon(s): Wendie Simmer, MD  Radiation oncologist: Dr. Tyler Pita  ANESTHESIA:  General  EBL:  Minimal  DRAINS: 81 French Foley catheter  INDICATION: Edward Hernandez  Description of procedure: After informed consent the patient was brought to the major OR, placed on the table and administered general anesthesia. He was then moved to the modified lithotomy position with his perineum perpendicular to the floor. His perineum and genitalia were then sterilely prepped. An official timeout was then performed. A 16 French Foley catheter was then placed in the bladder and filled with dilute contrast, a rectal tube was placed in the rectum and the transrectal ultrasound probe was placed in the rectum and affixed to the stand. He was then sterilely draped.  Real time ultrasonography was used along with the seed planning software [employer] Prostate. This was used to develop the seed plan including the number of needles as well as number of seeds required for complete and adequate coverage. Real-time ultrasonography was then used along with the previously developed plan and the Nucletron device to implant a total of 57 seeds using 15 needles. This proceeded without difficulty or complication.   I then proceeded with placement of SpaceOAR by introducing a needle with the bevel angled inferiorly approximately 2 cm superior to the anus. This was angled downward and under direct ultrasound was placed within the space between the prostatic capsule and rectum. This was confirmed with a small amount of sterile saline injected and this was performed under direct ultrasound. I then attached the SpaceOAR to the needle and injected this in the space between the prostate and rectum with  good placement noted.  A Foley catheter was then removed as well as the transrectal ultrasound probe and rectal probe. Flexible cystoscopy was then performed using the 17 French flexible scope which revealed a normal urethra throughout its length down to the sphincter which appeared intact. The prostatic urethra revealed bilobar hypertrophy but no evidence of obstruction, seeds, spacers or lesions. The bladder was then entered and fully and systematically inspected. The ureteral orifices were noted to be of normal configuration and position. The mucosa revealed no evidence of tumors. There were also no stones identified within the bladder. I noted no seeds or spacers on the floor of the bladder and retroflexion of the scope revealed no seeds protruding from the base of the prostate.  The cystoscope was then removed and the patient was awakened and taken to recovery room in stable and satisfactory condition. He tolerated procedure well and there were no intraoperative complications.

## 2021-11-17 NOTE — Discharge Instructions (Addendum)
Antibiotics You may be given a prescription for an antibiotic to take when you arrive home. If so, be sure to take every tablet in the bottle, even if you are feeling better before the prescription is finished. If you begin itching, notice a rash or start to swell on your trunk, arms, legs and/or throat, immediately stop taking the antibiotic and call your Urologist. Diet Resume your usual diet when you return home. To keep your bowels moving easily and softly, drink prune, apple and cranberry juice at room temperature. You may also take a stool softener, such as Colace, which is available without prescription at local pharmacies. Daily activities No driving or heavy lifting for at least two days after the implant. No bike riding, horseback riding or riding lawn mowers for the first month after the implant. Any strenuous physical activity should be approved by your doctor before you resume it. Sexual relations You may resume sexual relations two weeks after the procedure. A condom should be used for the first two weeks. Your semen may be dark brown or black; this is normal and is related bleeding that may have occurred during the implant. Postoperative swelling Expect swelling and bruising of the scrotum and perineum (the area between the scrotum and anus). Both the swelling and the bruising should resolve in l or 2 weeks. Ice packs and over- the-counter medications such as Tylenol, Advil or Aleve may lessen your discomfort. Postoperative urination Most men experience burning on urination and/or urinary frequency. If this becomes bothersome, contact your Urologist.  Medication can be prescribed to relieve these problems.  It is normal to have some blood in your urine for a few days after the implant. Special instructions related to the seeds It is unlikely that you will pass an Iodine-125 seed in your urine. The seeds are silver in color and are about as large as a grain of rice. If you pass a seed,  do not handle it with your fingers. Use a spoon to place it in an envelope or jar in place this in base occluded area such as the garage or basement for return to the radiation clinic at your convenience.  Contact your doctor for Temperature greater than 101 F Increasing pain Inability to urinate Follow-up  You should have follow up with your urologist and radiation oncologist about 3 weeks after the procedure. General information regarding Iodine seeds Iodine-125 is a low energy radioactive material. It is not deeply penetrating and loses energy at short distances. Your prostate will absorb the radiation. Objects that are touched or used by the patient do not become radioactive. Body wastes (urine and stool) or body fluids (saliva, tears, semen or blood) are not radioactive. The Nuclear Regulatory Commission (NRC) has determined that no radiation precautions are needed for patients undergoing Iodine-125 seed implantation. The NRC states that such patients do not present a risk to the people around them, including young children and pregnant women. However, in keeping with the general principle that radiation exposure should be kept as low reasonably possible, we suggest the following: Children and pets should not sit on the patient's lap for the first two (2) weeks after the implant. Pregnant (or possibly pregnant) women should avoid prolonged, close contact with the patient for the first two (2) weeks after the implant. A distance of three (3) feet is acceptable. At a distance of three (3) feet, there is no limit to the length of time anyone can be with the patient.   Post Anesthesia   Home Care Instructions  Activity: Get plenty of rest for the remainder of the day. A responsible individual must stay with you for 24 hours following the procedure.  For the next 24 hours, DO NOT: -Drive a car -Operate machinery -Drink alcoholic beverages -Take any medication unless instructed by your  physician -Make any legal decisions or sign important papers.  Meals: Start with liquid foods such as gelatin or soup. Progress to regular foods as tolerated. Avoid greasy, spicy, heavy foods. If nausea and/or vomiting occur, drink only clear liquids until the nausea and/or vomiting subsides. Call your physician if vomiting continues.  Special Instructions/Symptoms: Your throat may feel dry or sore from the anesthesia or the breathing tube placed in your throat during surgery. If this causes discomfort, gargle with warm salt water. The discomfort should disappear within 24 hours.      

## 2021-11-17 NOTE — Anesthesia Preprocedure Evaluation (Addendum)
Anesthesia Evaluation  Patient identified by MRN, date of birth, ID band Patient awake    Reviewed: Allergy & Precautions, H&P , NPO status , Patient's Chart, lab work & pertinent test results  Airway Mallampati: III  TM Distance: >3 FB Neck ROM: Full    Dental no notable dental hx. (+) Teeth Intact, Dental Advisory Given   Pulmonary Current Smoker and Patient abstained from smoking.,    Pulmonary exam normal breath sounds clear to auscultation       Cardiovascular hypertension, Pt. on medications  Rhythm:Regular Rate:Normal     Neuro/Psych negative neurological ROS  negative psych ROS   GI/Hepatic negative GI ROS, Neg liver ROS,   Endo/Other  diabetes, Type 2, Oral Hypoglycemic Agents  Renal/GU Renal InsufficiencyRenal disease  negative genitourinary   Musculoskeletal  (+) Arthritis , Rheumatoid disorders,    Abdominal   Peds  Hematology negative hematology ROS (+)   Anesthesia Other Findings   Reproductive/Obstetrics negative OB ROS                            Anesthesia Physical Anesthesia Plan  ASA: 3  Anesthesia Plan: General   Post-op Pain Management: Tylenol PO (pre-op)*   Induction: Intravenous  PONV Risk Score and Plan: 2 and Dexamethasone  Airway Management Planned: LMA  Additional Equipment:   Intra-op Plan:   Post-operative Plan: Extubation in OR  Informed Consent: I have reviewed the patients History and Physical, chart, labs and discussed the procedure including the risks, benefits and alternatives for the proposed anesthesia with the patient or authorized representative who has indicated his/her understanding and acceptance.     Dental advisory given  Plan Discussed with: CRNA  Anesthesia Plan Comments:         Anesthesia Quick Evaluation

## 2021-11-17 NOTE — Interval H&P Note (Signed)
History and Physical Interval Note:  11/17/2021 12:23 PM  Edward Hernandez  has presented today for surgery, with the diagnosis of PROSTATE CANCER.  The various methods of treatment have been discussed with the patient and family. After consideration of risks, benefits and other options for treatment, the patient has consented to  Procedure(s): RADIOACTIVE SEED IMPLANT/BRACHYTHERAPY IMPLANT (N/A) SPACE OAR INSTILLATION (N/A) as a surgical intervention.  The patient's history has been reviewed, patient examined, no change in status, stable for surgery.  I have reviewed the patient's chart and labs.  Questions were answered to the patient's satisfaction.     Marton Redwood, III

## 2021-11-17 NOTE — Anesthesia Procedure Notes (Signed)
Procedure Name: Intubation Date/Time: 11/17/2021 1:16 PM Performed by: Genelle Bal, CRNA Pre-anesthesia Checklist: Patient identified, Emergency Drugs available, Suction available and Patient being monitored Patient Re-evaluated:Patient Re-evaluated prior to induction Oxygen Delivery Method: Circle system utilized Preoxygenation: Pre-oxygenation with 100% oxygen Induction Type: IV induction Ventilation: Mask ventilation without difficulty and Oral airway inserted - appropriate to patient size Laryngoscope Size: Sabra Heck and 2 Grade View: Grade I Tube type: Oral Tube size: 7.5 mm Number of attempts: 1 Airway Equipment and Method: Stylet and Oral airway Placement Confirmation: ETT inserted through vocal cords under direct vision, positive ETCO2 and breath sounds checked- equal and bilateral Secured at: 21 cm Tube secured with: Tape Dental Injury: Teeth and Oropharynx as per pre-operative assessment

## 2021-11-17 NOTE — H&P (Signed)
CC/HPI: Edward Hernandez presents today for pre-operative history and physical exam in anticipation of brachytherapy and space oar placement by Dr. Gloriann Loan on 11/17/21. He is doing well and is without complaint.   Edward Hernandez denies F/C, HA, CP, SOB, N/V, diarrhea/constipation, back pain, flank pain, hematuria, and dysuria.     HX:   CC: Unfavorable intermediate risk prostate cancer  HPI:  05/04/2021  81 year old male referred for an elevated PSA of 5.21 on 03/28/2021. He also complains about some lower urinary tract symptoms including nocturia, weak stream, frequency. He also has some urgency. He does not take any medication for his prostate but is interested in trying some. He denies any hematuria dysuria.   08/15/2021  Patient doing well since prostate biopsy. This revealed a 36 g prostate. He did have a small nodule on examination. Unfortunately, biopsy revealed 6 out of 12 cores positive for prostate cancer, maximum Gleason score 4+3.   10/05/2021  In the interval, the patient met with Dr. Tammi Klippel. He has elected to proceed with brachytherapy with SpaceOAR. He and his significant other have questions today which were all answered to their satisfaction. Lower urinary tract symptoms have improved with taking tamsulosin.     ALLERGIES: No Allergies    MEDICATIONS: Aspirin 81 mg tablet,chewable  Tamsulosin Hcl 0.4 mg capsule 1 capsule PO Daily  Altace 5 mg capsule Oral  Amaryl 4 mg tablet Oral  Amlodipine Besilate  Farxiga  Krill Oil  Labetalol Hcl  Lokelma  Sodium Bicarbonate     GU PSH: Prostate Needle Biopsy - 08/08/2021       PSH Notes: Appendectomy For Ruptured Appendix, foot surgery, back surgery,  bilateral cataract surgery  multiple teeth extractions with implants  vasectomy   NON-GU PSH: Appendectomy (open) - 2008 Surgical Pathology, Gross And Microscopic Examination For Prostate Needle - 08/08/2021     GU PMH: BPH w/LUTS - 10/05/2021, - 05/04/2021 Nocturia - 10/05/2021, - 05/04/2021 Prostate  Cancer - 10/05/2021, - 08/15/2021 Elevated PSA - 08/08/2021, - 05/04/2021 Encounter for Prostate Cancer screening - 05/04/2021 Urinary Frequency - 05/04/2021 Urinary Urgency - 05/04/2021 Weak Urinary Stream - 05/04/2021      PMH Notes: CKD IV   Rheumatoid arthritis    NON-GU PMH: Personal history of other diseases of the circulatory system, History of hypertension - 2014 Personal history of other endocrine, nutritional and metabolic disease, History of diabetes mellitus - 2014 Diabetes Type 2 Encounter for general adult medical examination without abnormal findings, Encounter for preventive health examination Gout Hypertension Skin Cancer, History    FAMILY HISTORY: Family Health Status Number - Runs In Family   SOCIAL HISTORY: Marital Status: Married Preferred Language: English; Ethnicity: Not Hispanic Or Latino; Race: White, American [place] or [place] Native Current Smoking Status: Patient smokes. Has smoked since 11/01/1961.   Tobacco Use Assessment Completed: Used Tobacco in last 30 days? Does not use smokeless tobacco. Drinks 1 drink per day.  Does not use drugs. Drinks 2 caffeinated drinks per day. Has not had a blood transfusion.     Notes: Smokes 1 cigar a day  ETOH 5 drinks per week (beer)   REVIEW OF SYSTEMS:    GU Review Male:   Patient denies frequent urination, hard to postpone urination, burning/ pain with urination, get up at night to urinate, leakage of urine, stream starts and stops, trouble starting your stream, have to strain to urinate , erection problems, and penile pain.  Gastrointestinal (Upper):   Patient denies nausea, vomiting, and indigestion/ heartburn.  Gastrointestinal (Lower):  Patient denies diarrhea and constipation.  Constitutional:   Patient denies fever, night sweats, weight loss, and fatigue.  Skin:   Patient denies skin rash/ lesion and itching.  Eyes:   Patient denies blurred vision and double vision.  Ears/ Nose/ Throat:   Patient denies sore  throat and sinus problems.  Hematologic/Lymphatic:   Patient denies easy bruising and swollen glands.  Cardiovascular:   Patient denies leg swelling and chest pains.  Respiratory:   Patient denies cough and shortness of breath.  Endocrine:   Patient denies excessive thirst.  Musculoskeletal:   Patient denies back pain and joint pain.  Neurological:   Patient denies headaches and dizziness.  Psychologic:   Patient denies depression and anxiety.   VITAL SIGNS:      11/07/2021 12:50 PM  Weight 191 lb / 86.64 kg  Height 68 in / 172.72 cm  BP 138/77 mmHg  Pulse 51 /min  Temperature 97.3 F / 36.2 C  BMI 29.0 kg/m   MULTI-SYSTEM PHYSICAL EXAMINATION:    Constitutional: Well-nourished. No physical deformities. Normally developed. Good grooming.  Neck: Neck symmetrical, not swollen. Normal tracheal position.  Respiratory: Normal breath sounds. No labored breathing, no use of accessory muscles.   Cardiovascular: Regular rate and rhythm. No murmur, no gallop.   Lymphatic: No enlargement of neck, axillae, groin.  Skin: No paleness, no jaundice, no cyanosis. No lesion, no ulcer, no rash.  Neurologic / Psychiatric: Oriented to time, oriented to place, oriented to person. No depression, no anxiety, no agitation.  Gastrointestinal: No mass, no tenderness, no rigidity, non obese abdomen.  Eyes: Normal conjunctivae. Normal eyelids.  Ears, Nose, Mouth, and Throat: Left ear no scars, no lesions, no masses. Right ear no scars, no lesions, no masses. Nose no scars, no lesions, no masses. Normal hearing. Normal lips.  Musculoskeletal: Normal gait and station of head and neck.     Complexity of Data:  Records Review:   Previous Patient Records  Urine Test Review:   Urinalysis   11/07/21  Urinalysis  Urine Appearance Clear   Urine Color Yellow   Urine Glucose 3+ mg/dL  Urine Bilirubin Neg mg/dL  Urine Ketones Neg mg/dL  Urine Specific Gravity 1.020   Urine Blood Neg ery/uL  Urine pH <=5.0    Urine Protein Trace mg/dL  Urine Urobilinogen 0.2 mg/dL  Urine Nitrites Neg   Urine Leukocyte Esterase Neg leu/uL   PROCEDURES:          Urinalysis - 81003 Dipstick Dipstick Cont'd  Color: Yellow Bilirubin: Neg mg/dL  Appearance: Clear Ketones: Neg mg/dL  Specific Gravity: 1.020 Blood: Neg ery/uL  pH: <=5.0 Protein: Trace mg/dL  Glucose: 3+ mg/dL Urobilinogen: 0.2 mg/dL    Nitrites: Neg    Leukocyte Esterase: Neg leu/uL    ASSESSMENT:      ICD-10 Details  1 GU:   Prostate Cancer - C61    PLAN:           Schedule Return Visit/Planned Activity: Keep Scheduled Appointment - Schedule Surgery          Document Letter(s):  Created for Patient: Clinical Summary         Notes:   There are no changes in the patients history or physical exam since last evaluation by Dr. Gloriann Loan. Edward Hernandez is scheduled to undergo brachytherapy and space oar on 11/17/21.   All Edward Hernandez's questions were answered to the best of my ability.          Next Appointment:  Next Appointment: 11/17/2021 01:00 PM    Appointment Type: Surgery     Location: [facility] [phone]    Provider: Link Snuffer, III, M.D.    Reason for Visit: OP NE SEEDS SPACE OAR      Signed by Mcarthur Rossetti, PA on 11/07/21 at 1:18 PM (EST)

## 2021-11-17 NOTE — Progress Notes (Signed)
°  Radiation Oncology         [phone]) [phone] ________________________________  Name: Edward Hernandez MRN: [id number]  Date: 11/17/2021  DOB: [birth year]       Prostate Seed Implant  WG:YKZLD, Bonnita Levan, MD  No ref. provider found  DIAGNOSIS:  81 y.o. gentleman with Stage T2a adenocarcinoma of the prostate with Gleason score of 4+3, and PSA of 5.21.  Oncology History  Malignant neoplasm of prostate ([place])  08/08/2021 Cancer Staging   Staging form: Prostate, AJCC 8th Edition - Clinical stage from 08/08/2021: Stage IIC (cT2a, cN0, cM0, PSA: 5.2, Grade Group: 3) - Signed by Freeman Caldron, PA-C on 08/22/2021 Histopathologic type: Adenocarcinoma, NOS Stage prefix: Initial diagnosis Prostate specific antigen (PSA) range: Less than 10 Gleason primary pattern: 4 Gleason secondary pattern: 3 Gleason score: 7 Histologic grading system: 5 grade system Number of biopsy cores examined: 12 Number of biopsy cores positive: 6 Location of positive needle core biopsies: Both sides    08/22/2021 Initial Diagnosis   Malignant neoplasm of prostate ([place])     No diagnosis found.  PROCEDURE: Insertion of radioactive I-125 seeds into the prostate gland.  RADIATION DOSE: 145 Gy, definitive therapy.  TECHNIQUE: Edward Hernandez was brought to the operating room with the urologist. He was placed in the dorsolithotomy position. He was catheterized and a rectal tube was inserted. The perineum was shaved, prepped and draped. The ultrasound probe was then introduced into the rectum to see the prostate gland.  TREATMENT DEVICE: A needle grid was attached to the ultrasound probe stand and anchor needles were placed.  3D PLANNING: The prostate was imaged in 3D using a sagittal sweep of the prostate probe. These images were transferred to the planning computer. There, the prostate, urethra and rectum were defined on each axial reconstructed image. Then, the software created an optimized 3D plan and a few seed positions  were adjusted. The quality of the plan was reviewed using [facility] information for the target and the following two organs at risk:  Urethra and Rectum.  Then the accepted plan was printed and handed off to the radiation therapist.  Under my supervision, the custom loading of the seeds and spacers was carried out and loaded into sealed vicryl sleeves.  These pre-loaded needles were then placed into the needle holder.Marland Kitchen  PROSTATE VOLUME STUDY:  Using transrectal ultrasound the volume of the prostate was verified to be 34.4 cc.  SPECIAL TREATMENT PROCEDURE/SUPERVISION AND HANDLING: The pre-loaded needles were then delivered under sagittal guidance. A total of 15 needles were used to deposit 57 seeds in the prostate gland. The individual seed activity was 0.428 mCi.  SpaceOAR:  Yes  COMPLEX SIMULATION: At the end of the procedure, an anterior radiograph of the pelvis was obtained to document seed positioning and count. Cystoscopy was performed to check the urethra and bladder.  MICRODOSIMETRY: At the end of the procedure, the patient was emitting 0.074 mR/hr at 1 meter. Accordingly, he was considered safe for hospital discharge.  PLAN: The patient will return to the radiation oncology clinic for post implant CT dosimetry in three weeks.   ________________________________  Sheral Apley Tammi Klippel, M.D.

## 2021-11-20 ENCOUNTER — Encounter (HOSPITAL_BASED_OUTPATIENT_CLINIC_OR_DEPARTMENT_OTHER): Payer: Self-pay | Admitting: Urology

## 2021-12-06 ENCOUNTER — Telehealth: Payer: Self-pay | Admitting: *Deleted

## 2021-12-06 DIAGNOSIS — N183 Chronic kidney disease, stage 3 unspecified: Secondary | ICD-10-CM | POA: Diagnosis not present

## 2021-12-06 DIAGNOSIS — I10 Essential (primary) hypertension: Secondary | ICD-10-CM | POA: Diagnosis not present

## 2021-12-06 NOTE — Progress Notes (Signed)
?  Radiation Oncology         [phone] ?________________________________ ? ?Name: Edward Hernandez MRN: [id number]  ?Date: 12/07/2021  DOB: 01-Oct-1941 ? ?COMPLEX SIMULATION NOTE ? ?NARRATIVE:  The patient was brought to the [facility] today following prostate seed implantation approximately one month ago.  Identity was confirmed.  All relevant records and images related to the planned course of therapy were reviewed.  Then, the patient was set-up supine.  CT images were obtained.  The CT images were loaded into the planning software.  Then the prostate and rectum were contoured.  Treatment planning then occurred.  The implanted iodine 125 seeds were identified by the physics staff for projection of radiation distribution  I have requested : 3D Simulation  I have requested a DVH of the following structures: Prostate and rectum.   ? ?________________________________ ? ?Sheral Apley Tammi Klippel, M.D. ? ?

## 2021-12-06 NOTE — Telephone Encounter (Signed)
Called patient to remind of post seed appts. for 12-07-21, spoke with patient and he is aware of these appts. ?

## 2021-12-06 NOTE — Progress Notes (Signed)
?Radiation Oncology         [phone] ?________________________________ ? ?Name: LIPA KNAUFF MRN: [id number]  ?Date: 12/07/2021  DOB: [birth year] ? ?Post-Seed Follow-Up Visit Note ? ?CC: Mosie Lukes, MD  Lucas Mallow, MD ? ?Diagnosis:   81 y.o. gentleman with Stage T2a adenocarcinoma of the prostate with Gleason score of 4+3, and PSA of 5.21. ? ?  ICD-10-CM   ?1. Malignant neoplasm of prostate ([place])  C61   ?  ? ? ?Interval Since Last Radiation:  3 weeks ?11/17/21:  Insertion of radioactive I-125 seeds into the prostate gland; 145 Gy, definitive therapy with placement of SpaceOAR gel. ? ?Narrative:  The patient returns today for routine follow-up.  He is complaining of increased urinary frequency and urinary hesitation symptoms. He filled out a questionnaire regarding urinary function today providing and overall IPSS score of 31 characterizing his symptoms as severe with nocturia x5, weak stream, hesitancy, intermittency and mild dysuria. He has continued taking Flomax daily as prescribed and feels that this LUTS are gradually improving.  His pre-implant score was 20 but was not taking Flomax regularly as prescribed. He denies any abdominal pain or bowel symptoms aside form occasional constipation which is managed with stool softeners. He has not noticed much change in his energy level and has remained active. He is rady to get back to his Tai-chi. Overall, he is pleased with his progress to date. ? ?ALLERGIES:  has No Known Allergies. ? ?Meds: ?Current Outpatient Medications  ?Medication Sig Dispense Refill  ? acetaminophen (TYLENOL) 650 MG CR tablet Take 1,300 mg by mouth every 8 (eight) hours as needed for pain.    ? amLODipine (NORVASC) 2.5 MG tablet TAKE 1 TABLET BY MOUTH EVERY DAY 90 tablet 1  ? aspirin 81 MG tablet Take 81 mg by mouth daily.      ? B Complex-Biotin-FA (SUPER B-50 COMPLEX PO) Take by mouth daily. Super b complex    ? dapagliflozin propanediol (FARXIGA) 10 MG TABS tablet Take 10 mg  by mouth daily.    ? HYDROcodone-acetaminophen (NORCO/VICODIN) 5-325 MG tablet Take 1 tablet by mouth every 4 (four) hours as needed for moderate pain. 20 tablet 0  ? Krill Oil 300 MG CAPS Take 300 mg by mouth daily.    ? labetalol (NORMODYNE) 100 MG tablet Take 200 mg by mouth 2 (two) times daily.    ? Misc Natural Products (ELDERBERRY ZINC/VIT C/IMMUNE MT) Use as directed in the mouth or throat.    ? sodium bicarbonate 650 MG tablet Take 650 mg by mouth 3 (three) times daily.    ? sodium zirconium cyclosilicate (LOKELMA) 10 g PACK packet Take 10 g by mouth 2 (two) times a week.    ? tamsulosin (FLOMAX) 0.4 MG CAPS capsule Take 0.4 mg by mouth daily after supper.    ? UNABLE TO FIND Garden veggies 1 cap per day    ? UNABLE TO FIND Blue emu cream prn to knees    ? ?No current facility-administered medications for this visit.  ? ? ?Physical Findings: ?In general this is a well appearing Caucasian male in no acute distress. He's alert and oriented x4 and appropriate throughout the examination. Cardiopulmonary assessment is negative for acute distress and he exhibits normal effort.  ? ?Lab Findings: ?Lab Results  ?Component Value Date  ? WBC 6.2 03/28/2021  ? HGB 13.9 11/17/2021  ? HCT 41.0 11/17/2021  ? MCV 93.8 03/28/2021  ? PLT 147.0 (L) 03/28/2021  ? ? ?  Radiographic Findings:  ?Patient underwent CT imaging in our clinic for post implant dosimetry. The CT will be reviewed by Dr. Tammi Klippel to confirm there is an adequate distribution of radioactive seeds throughout the prostate gland and ensure that there are no seeds in or near the rectum.  We suspect the final radiation plan and dosimetry will show appropriate coverage of the prostate gland. He understands that we will call and inform him of any unexpected findings on further review of his imaging and dosimetry. ? ?Impression/Plan: 81 y.o. gentleman with Stage T2a adenocarcinoma of the prostate with Gleason score of 4+3, and PSA of 5.21. ?The patient is recovering  from the effects of radiation. His urinary symptoms should gradually improve over the next 4-6 months. We talked about this today and he will continue taking the Flomax daily as prescribed. He is encouraged by his improvement already and is otherwise pleased with his outcome. We also talked about long-term follow-up for prostate cancer following seed implant. He understands that ongoing PSA determinations and digital rectal exams will help perform surveillance to rule out disease recurrence. He saw Jiles Crocker, NP on 12/01/21 and is scheduled for labwork on 03/01/22 and a follow up with Dr. Gloriann Loan the following week. He understands what to expect with his PSA measures. Patient was also educated today about some of the long-term effects from radiation including a small risk for rectal bleeding and possibly erectile dysfunction. We talked about some of the general management approaches to these potential complications. However, I did encourage the patient to contact our office or return at any point if he has questions or concerns related to his previous radiation and prostate cancer. ? ? ? ?Nicholos Johns, PA-C ? ?

## 2021-12-07 ENCOUNTER — Other Ambulatory Visit: Payer: Self-pay | Admitting: Urology

## 2021-12-07 ENCOUNTER — Ambulatory Visit
Admission: RE | Admit: 2021-12-07 | Discharge: 2021-12-07 | Disposition: A | Discharge status: Home / Self Care | Payer: Medicare Other | Source: Ambulatory Visit | Attending: Radiation Oncology | Admitting: Radiation Oncology

## 2021-12-07 ENCOUNTER — Other Ambulatory Visit: Payer: Self-pay

## 2021-12-07 ENCOUNTER — Ambulatory Visit
Admission: RE | Admit: 2021-12-07 | Discharge: 2021-12-07 | Disposition: A | Discharge status: Home / Self Care | Payer: Medicare Other | Source: Ambulatory Visit | Attending: Urology | Admitting: Urology

## 2021-12-07 ENCOUNTER — Encounter: Payer: Self-pay | Admitting: Urology

## 2021-12-07 VITALS — BP 120/61 | HR 58 | Temp 97.8°F | Resp 20 | Ht 68.0 in | Wt 184.4 lb

## 2021-12-07 DIAGNOSIS — C61 Malignant neoplasm of prostate: Secondary | ICD-10-CM | POA: Diagnosis present

## 2021-12-07 DIAGNOSIS — Z510 Encounter for antineoplastic radiation therapy: Secondary | ICD-10-CM | POA: Diagnosis not present

## 2021-12-07 NOTE — Progress Notes (Signed)
Patient is here today for a post -seed appointment. Patient reports  ?Dysuria  Some ?Hematuria  None ?Nocturia  x 5 ?Leakage  Some ?Urgency Some ?Bowels   Patient states that he has constipation sometime ,but states that he takes a stool softener for relief. ?Emptying bladder  Most of the time  ?Stream Weak ?IPPS is 31 ?Vitals:  ? 12/07/21 0923  ?BP: 120/61  ?Pulse: (!) 58  ?Resp: 20  ?Temp: 97.8 ?F (36.6 ?C)  ?SpO2: 100%  ?Weight: 83.6 kg  ?Height: '5\' 8"'$  (1.727 m)  ? ? ?

## 2021-12-11 DIAGNOSIS — I10 Essential (primary) hypertension: Secondary | ICD-10-CM | POA: Diagnosis not present

## 2021-12-11 DIAGNOSIS — N390 Urinary tract infection, site not specified: Secondary | ICD-10-CM | POA: Diagnosis not present

## 2021-12-11 DIAGNOSIS — N183 Chronic kidney disease, stage 3 unspecified: Secondary | ICD-10-CM | POA: Diagnosis not present

## 2021-12-11 DIAGNOSIS — E119 Type 2 diabetes mellitus without complications: Secondary | ICD-10-CM | POA: Diagnosis not present

## 2021-12-18 ENCOUNTER — Encounter: Payer: Self-pay | Admitting: Radiation Oncology

## 2021-12-18 DIAGNOSIS — Z510 Encounter for antineoplastic radiation therapy: Secondary | ICD-10-CM | POA: Diagnosis not present

## 2022-01-01 ENCOUNTER — Telehealth: Payer: Self-pay | Admitting: *Deleted

## 2022-01-02 ENCOUNTER — Telehealth: Payer: Self-pay | Admitting: *Deleted

## 2022-01-14 NOTE — Progress Notes (Signed)
?  Radiation Oncology         [phone] ?________________________________ ? ?Name: Edward Hernandez MRN: [id number]  ?Date: 12/18/2021  DOB: 1941-07-24 ? ?3D Planning Note  ? ?Prostate Brachytherapy Post-Implant Dosimetry ? ?Diagnosis: 81 y.o. gentleman with Stage T2a adenocarcinoma of the prostate with Gleason score of 4+3, and PSA of 5.21. ? ?Narrative: On a previous date, Edward Hernandez returned following prostate seed implantation for post implant planning. He underwent CT scan complex simulation to delineate the three-dimensional structures of the pelvis and demonstrate the radiation distribution.  Since that time, the seed localization, and complex isodose planning with dose volume histograms have now been completed. ? ?Results:   ?Prostate Coverage - The dose of radiation delivered to the 90% or more of the prostate gland (D90) was 106.65% of the prescription dose. This exceeds our goal of greater than 90%. ?Rectal Sparing - The volume of rectal tissue receiving the prescription dose or higher was 0.0 cc. This falls under our thresholds tolerance of 1.0 cc. ? ?Impression: The prostate seed implant appears to show adequate target coverage and appropriate rectal sparing. ? ?Plan:  The patient will continue to follow with urology for ongoing PSA determinations. I would anticipate a high likelihood for local tumor control with minimal risk for rectal morbidity. ? ?________________________________ ? ?Sheral Apley Tammi Klippel, M.D. ? ?

## 2022-02-05 DIAGNOSIS — R300 Dysuria: Secondary | ICD-10-CM | POA: Diagnosis not present

## 2022-02-05 DIAGNOSIS — R338 Other retention of urine: Secondary | ICD-10-CM | POA: Diagnosis not present

## 2022-02-09 ENCOUNTER — Telehealth: Payer: Self-pay | Admitting: *Deleted

## 2022-02-13 ENCOUNTER — Inpatient Hospital Stay: Payer: Medicare Other | Attending: Radiation Oncology | Admitting: *Deleted

## 2022-02-13 ENCOUNTER — Telehealth: Payer: Self-pay | Admitting: Family Medicine

## 2022-02-13 ENCOUNTER — Encounter: Payer: Self-pay | Admitting: *Deleted

## 2022-02-13 ENCOUNTER — Other Ambulatory Visit: Payer: Self-pay

## 2022-02-13 VITALS — BP 135/70 | HR 65 | Temp 98.3°F | Ht 68.0 in | Wt 186.3 lb

## 2022-02-13 DIAGNOSIS — C61 Malignant neoplasm of prostate: Secondary | ICD-10-CM

## 2022-02-13 NOTE — Progress Notes (Signed)
Pt was  seen today at [place] visit. Pt 's vital signs within normal range. Pt denies pain today. Pt continues with intermittent catheterizations  4-5 times daily following a period of incomplete emptying of bladder with urination and an UTI which was treated with ABX. Pt completed course of antibiotics. Pt has a f/u with Dr. Gloriann Loan, his urologist on May 22nd. Pt states he does feel better since bladder infection is gone and he's getting more sleep and not as fatigued as in the beginning after his treatment.  Pt is hopeful that he will not have to continue with the catheters in the near future. Bowels are moving better, he did say he had a previous bout with constipation. I discussed the importance of eating healthier and reviewed the [employer] with him and his wife. Pt does Tai Chi 4- 5 times weekly and yardwork. Pt will see PCP in June. Pt does smoke 1 cigar everyday.Pt stated he never smoked cigarettes. I did ask him about  lung cancer screening. I will put in order today. He does see dermatologist yearly. Pt will discuss with PCP about getting a cologuard.  ?

## 2022-02-13 NOTE — Telephone Encounter (Signed)
Copied from [place] [id number]. Topic: Medicare AWV ?>> Feb 13, 2022  2:30 PM Harris-Coley, Hannah Beat wrote: ?Reason for CRM: Left message for patient to schedule Annual Wellness Visit.  Please schedule (telephone/video call) with Nurse Health Advisor Charlott Rakes, RN at [facility]. Please call [phone] ask for Juliann Pulse ?

## 2022-02-19 DIAGNOSIS — R338 Other retention of urine: Secondary | ICD-10-CM | POA: Diagnosis not present

## 2022-02-27 ENCOUNTER — Ambulatory Visit (INDEPENDENT_AMBULATORY_CARE_PROVIDER_SITE_OTHER): Payer: Medicare Other

## 2022-02-27 DIAGNOSIS — Z0000 Encounter for general adult medical examination without abnormal findings: Secondary | ICD-10-CM
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
## 2022-02-27 NOTE — Patient Instructions (Signed)
[name] , Thank you for taking time to come for your Medicare Wellness Visit. I appreciate your ongoing commitment to your health goals. Please review the following plan we discussed and let me know if I can assist you in the future.   Screening recommendations/referrals: Colonoscopy: no longer needed Recommended yearly ophthalmology/optometry visit for glaucoma screening and checkup Recommended yearly dental visit for hygiene and checkup  Vaccinations: Influenza vaccine: declined Pneumococcal vaccine: up to date Tdap vaccine: up to date Shingles vaccine: Due-May obtain vaccine at your local pharmacy.    Covid-19: declined  Advanced directives: yes, not on file  Conditions/risks identified: see problem list   Next appointment: Follow up in one year for your annual wellness visit.   Preventive Care 44 Years and Older, Male Preventive care refers to lifestyle choices and visits with your health care provider that can promote health and wellness. What does preventive care include? A yearly physical exam. This is also called an annual well check. Dental exams once or twice a year. Routine eye exams. Ask your health care provider how often you should have your eyes checked. Personal lifestyle choices, including: Daily care of your teeth and gums. Regular physical activity. Eating a healthy diet. Avoiding tobacco and drug use. Limiting alcohol use. Practicing safe sex. Taking low doses of aspirin every day. Taking vitamin and mineral supplements as recommended by your health care provider. What happens during an annual well check? The services and screenings done by your health care provider during your annual well check will depend on your age, overall health, lifestyle risk factors, and family history of disease. Counseling  Your health care provider may ask you questions about your: Alcohol use. Tobacco use. Drug use. Emotional well-being. Home and relationship  well-being. Sexual activity. Eating habits. History of falls. Memory and ability to understand (cognition). Work and work Statistician. Screening  You may have the following tests or measurements: Height, weight, and BMI. Blood pressure. Lipid and cholesterol levels. These may be checked every 5 years, or more frequently if you are over 66 years old. Skin check. Lung cancer screening. You may have this screening every year starting at age 5 if you have a 30-pack-year history of smoking and currently smoke or have quit within the past 15 years. Fecal occult blood test (FOBT) of the stool. You may have this test every year starting at age 51. Flexible sigmoidoscopy or colonoscopy. You may have a sigmoidoscopy every 5 years or a colonoscopy every 10 years starting at age 41. Prostate cancer screening. Recommendations will vary depending on your family history and other risks. Hepatitis C blood test. Hepatitis B blood test. Sexually transmitted disease (STD) testing. Diabetes screening. This is done by checking your blood sugar (glucose) after you have not eaten for a while (fasting). You may have this done every 1-3 years. Abdominal aortic aneurysm (AAA) screening. You may need this if you are a current or former smoker. Osteoporosis. You may be screened starting at age 73 if you are at high risk. Talk with your health care provider about your test results, treatment options, and if necessary, the need for more tests. Vaccines  Your health care provider may recommend certain vaccines, such as: Influenza vaccine. This is recommended every year. Tetanus, diphtheria, and acellular pertussis (Tdap, Td) vaccine. You may need a Td booster every 10 years. Zoster vaccine. You may need this after age 77. Pneumococcal 13-valent conjugate (PCV13) vaccine. One dose is recommended after age 67. Pneumococcal polysaccharide (PPSV23) vaccine.  One dose is recommended after age 77. Talk to your health care  provider about which screenings and vaccines you need and how often you need them. This information is not intended to replace advice given to you by your health care provider. Make sure you discuss any questions you have with your health care provider. Document Released: 10/14/2015 Document Revised: 06/06/2016 Document Reviewed: 07/19/2015 Elsevier Interactive Patient Education  2017 [place] Prevention in the Home Falls can cause injuries. They can happen to people of all ages. There are many things you can do to make your home safe and to help prevent falls. What can I do on the outside of my home? Regularly fix the edges of walkways and driveways and fix any cracks. Remove anything that might make you trip as you walk through a door, such as a raised step or threshold. Trim any bushes or trees on the path to your home. Use bright outdoor lighting. Clear any walking paths of anything that might make someone trip, such as rocks or tools. Regularly check to see if handrails are loose or broken. Make sure that both sides of any steps have handrails. Any raised decks and porches should have guardrails on the edges. Have any leaves, snow, or ice cleared regularly. Use sand or salt on walking paths during winter. Clean up any spills in your garage right away. This includes oil or grease spills. What can I do in the bathroom? Use night lights. Install grab bars by the toilet and in the tub and shower. Do not use towel bars as grab bars. Use non-skid mats or decals in the tub or shower. If you need to sit down in the shower, use a plastic, non-slip stool. Keep the floor dry. Clean up any water that spills on the floor as soon as it happens. Remove soap buildup in the tub or shower regularly. Attach bath mats securely with double-sided non-slip rug tape. Do not have throw rugs and other things on the floor that can make you trip. What can I do in the bedroom? Use night lights. Make  sure that you have a light by your bed that is easy to reach. Do not use any sheets or blankets that are too big for your bed. They should not hang down onto the floor. Have a firm chair that has side arms. You can use this for support while you get dressed. Do not have throw rugs and other things on the floor that can make you trip. What can I do in the kitchen? Clean up any spills right away. Avoid walking on wet floors. Keep items that you use a lot in easy-to-reach places. If you need to reach something above you, use a strong step stool that has a grab bar. Keep electrical cords out of the way. Do not use floor polish or wax that makes floors slippery. If you must use wax, use non-skid floor wax. Do not have throw rugs and other things on the floor that can make you trip. What can I do with my stairs? Do not leave any items on the stairs. Make sure that there are handrails on both sides of the stairs and use them. Fix handrails that are broken or loose. Make sure that handrails are as long as the stairways. Check any carpeting to make sure that it is firmly attached to the stairs. Fix any carpet that is loose or worn. Avoid having throw rugs at the top or bottom of  the stairs. If you do have throw rugs, attach them to the floor with carpet tape. Make sure that you have a light switch at the top of the stairs and the bottom of the stairs. If you do not have them, ask someone to add them for you. What else can I do to help prevent falls? Wear shoes that: Do not have high heels. Have rubber bottoms. Are comfortable and fit you well. Are closed at the toe. Do not wear sandals. If you use a stepladder: Make sure that it is fully opened. Do not climb a closed stepladder. Make sure that both sides of the stepladder are locked into place. Ask someone to hold it for you, if possible. Clearly mark and make sure that you can see: Any grab bars or handrails. First and last steps. Where the  edge of each step is. Use tools that help you move around (mobility aids) if they are needed. These include: Canes. Walkers. Scooters. Crutches. Turn on the lights when you go into a dark area. Replace any light bulbs as soon as they burn out. Set up your furniture so you have a clear path. Avoid moving your furniture around. If any of your floors are uneven, fix them. If there are any pets around you, be aware of where they are. Review your medicines with your doctor. Some medicines can make you feel dizzy. This can increase your chance of falling. Ask your doctor what other things that you can do to help prevent falls. This information is not intended to replace advice given to you by your health care provider. Make sure you discuss any questions you have with your health care provider. Document Released: 07/14/2009 Document Revised: 02/23/2016 Document Reviewed: 10/22/2014 Elsevier Interactive Patient Education  2017 [employer].

## 2022-03-08 DIAGNOSIS — R338 Other retention of urine: Secondary | ICD-10-CM | POA: Diagnosis not present

## 2022-03-15 ENCOUNTER — Other Ambulatory Visit: Payer: Self-pay | Admitting: Family Medicine

## 2022-03-23 DIAGNOSIS — R338 Other retention of urine: Secondary | ICD-10-CM | POA: Diagnosis not present
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
## 2022-03-29 ENCOUNTER — Ambulatory Visit (INDEPENDENT_AMBULATORY_CARE_PROVIDER_SITE_OTHER): Payer: Medicare Other | Admitting: Family Medicine

## 2022-03-29 ENCOUNTER — Encounter: Payer: Self-pay | Admitting: Family Medicine

## 2022-03-29 VITALS — BP 124/70 | HR 60 | Ht 68.0 in | Wt 188.8 lb

## 2022-03-29 DIAGNOSIS — F17200 Nicotine dependence, unspecified, uncomplicated: Secondary | ICD-10-CM

## 2022-03-29 DIAGNOSIS — Z0000 Encounter for general adult medical examination without abnormal findings: Secondary | ICD-10-CM

## 2022-03-29 DIAGNOSIS — N1832 Chronic kidney disease, stage 3b: Secondary | ICD-10-CM | POA: Diagnosis not present

## 2022-03-29 DIAGNOSIS — G63 Polyneuropathy in diseases classified elsewhere: Secondary | ICD-10-CM | POA: Diagnosis not present

## 2022-03-29 DIAGNOSIS — M1990 Unspecified osteoarthritis, unspecified site: Secondary | ICD-10-CM

## 2022-03-29 DIAGNOSIS — I10 Essential (primary) hypertension: Secondary | ICD-10-CM

## 2022-03-29 DIAGNOSIS — R7309 Other abnormal glucose: Secondary | ICD-10-CM

## 2022-03-29 DIAGNOSIS — E782 Mixed hyperlipidemia: Secondary | ICD-10-CM

## 2022-03-29 DIAGNOSIS — Z1211 Encounter for screening for malignant neoplasm of colon: Secondary | ICD-10-CM

## 2022-03-29 DIAGNOSIS — C61 Malignant neoplasm of prostate: Secondary | ICD-10-CM

## 2022-03-29 DIAGNOSIS — R972 Elevated prostate specific antigen [PSA]: Secondary | ICD-10-CM

## 2022-03-29 LAB — HEMOGLOBIN A1C: Hgb A1c MFr Bld: 6.1 % (ref 4.6–6.5)

## 2022-03-29 LAB — COMPREHENSIVE METABOLIC PANEL
ALT: 17 U/L (ref 0–53)
AST: 19 U/L (ref 0–37)
Albumin: 4.3 g/dL (ref 3.5–5.2)
Alkaline Phosphatase: 102 U/L (ref 39–117)
BUN: 34 mg/dL — ABNORMAL HIGH (ref 6–23)
CO2: 25 mEq/L (ref 19–32)
Calcium: 9.5 mg/dL (ref 8.4–10.5)
Chloride: 110 mEq/L (ref 96–112)
Creatinine, Ser: 1.89 mg/dL — ABNORMAL HIGH (ref 0.40–1.50)
GFR: 33.26 mL/min — ABNORMAL LOW (ref >60.00)
Glucose, Bld: 106 mg/dL — ABNORMAL HIGH (ref 70–99)
Potassium: 4.6 mEq/L (ref 3.5–5.1)
Sodium: 143 mEq/L (ref 135–145)
Total Bilirubin: 0.7 mg/dL (ref 0.2–1.2)
Total Protein: 6.7 g/dL (ref 6.0–8.3)

## 2022-03-29 LAB — LIPID PANEL
Cholesterol: 128 mg/dL (ref 0–200)
HDL: 36.5 mg/dL — ABNORMAL LOW (ref >39.00)
LDL Cholesterol: 64 mg/dL (ref 0–99)
NonHDL: 91.18
Total CHOL/HDL Ratio: 3
Triglycerides: 138 mg/dL (ref 0.0–149.0)
VLDL: 27.6 mg/dL (ref 0.0–40.0)

## 2022-03-29 LAB — CBC
HCT: 35.5 % — ABNORMAL LOW (ref 39.0–52.0)
Hemoglobin: 11.7 g/dL — ABNORMAL LOW (ref 13.0–17.0)
MCHC: 33 g/dL (ref 30.0–36.0)
MCV: 91.5 fl (ref 78.0–100.0)
Platelets: 146 10*3/uL — ABNORMAL LOW (ref 150.0–400.0)
RBC: 3.89 Mil/uL — ABNORMAL LOW (ref 4.22–5.81)
RDW: 15.2 % (ref 11.5–15.5)
WBC: 7 10*3/uL (ref 4.0–10.5)

## 2022-03-29 LAB — TSH: TSH: 1.26 u[IU]/mL (ref 0.35–5.50)

## 2022-03-29 NOTE — Assessment & Plan Note (Signed)
Hydrate and monitor 

## 2022-03-29 NOTE — Assessment & Plan Note (Signed)
Patient encouraged to maintain heart healthy diet, regular exercise, adequate sleep. Consider daily probiotics. Take medications as prescribed.  Labs ordered and reviewed. Brought in his ACP documents. Set up for cologuard. Needs Shingrix shot,

## 2022-03-29 NOTE — Assessment & Plan Note (Signed)
Well controlled, no changes to meds. Encouraged heart healthy diet such as the DASH diet and exercise as tolerated.  °

## 2022-03-29 NOTE — Patient Instructions (Addendum)
Shingrix is the new shingles shot, 2 shots over 2-6 months, confirm coverage with insurance and document, then can return here for shots with nurse appt or at [place] 65 Years and Older, Male Preventive care refers to lifestyle choices and visits with your health care provider that can promote health and wellness. Preventive care visits are also called wellness exams. What can I expect for my preventive care visit? Counseling During your preventive care visit, your health care provider may ask about your: Medical history, including: Past medical problems. Family medical history. History of falls. Current health, including: Emotional well-being. Home life and relationship well-being. Sexual activity. Memory and ability to understand (cognition). Lifestyle, including: Alcohol, nicotine or tobacco, and drug use. Access to firearms. Diet, exercise, and sleep habits. Work and work Statistician. Sunscreen use. Safety issues such as seatbelt and bike helmet use. Physical exam Your health care provider will check your: Height and weight. These may be used to calculate your BMI (body mass index). BMI is a measurement that tells if you are at a healthy weight. Waist circumference. This measures the distance around your waistline. This measurement also tells if you are at a healthy weight and may help predict your risk of certain diseases, such as type 2 diabetes and high blood pressure. Heart rate and blood pressure. Body temperature. Skin for abnormal spots. What immunizations do I need?  Vaccines are usually given at various ages, according to a schedule. Your health care provider will recommend vaccines for you based on your age, medical history, and lifestyle or other factors, such as travel or where you work. What tests do I need? Screening Your health care provider may recommend screening tests for certain conditions. This may include: Lipid and cholesterol  levels. Diabetes screening. This is done by checking your blood sugar (glucose) after you have not eaten for a while (fasting). Hepatitis C test. Hepatitis B test. HIV (human immunodeficiency virus) test. STI (sexually transmitted infection) testing, if you are at risk. Lung cancer screening. Colorectal cancer screening. Prostate cancer screening. Abdominal aortic aneurysm (AAA) screening. You may need this if you are a current or former smoker. Talk with your health care provider about your test results, treatment options, and if necessary, the need for more tests. Follow these instructions at home: Eating and drinking  Eat a diet that includes fresh fruits and vegetables, whole grains, lean protein, and low-fat dairy products. Limit your intake of foods with high amounts of sugar, saturated fats, and salt. Take vitamin and mineral supplements as recommended by your health care provider. Do not drink alcohol if your health care provider tells you not to drink. If you drink alcohol: Limit how much you have to 0-2 drinks a day. Know how much alcohol is in your drink. In the [place] one drink equals one 12 oz bottle of beer (355 mL), one 5 oz glass of wine (148 mL), or one 1 oz glass of hard liquor (44 mL). Lifestyle Brush your teeth every morning and night with fluoride toothpaste. Floss one time each day. Exercise for at least 30 minutes 5 or more days each week. Do not use any products that contain nicotine or tobacco. These products include cigarettes, chewing tobacco, and vaping devices, such as e-cigarettes. If you need help quitting, ask your health care provider. Do not use drugs. If you are sexually active, practice safe sex. Use a condom or other form of protection to prevent STIs. Take aspirin only as told  by your health care provider. Make sure that you understand how much to take and what form to take. Work with your health care provider to find out whether it is safe and  beneficial for you to take aspirin daily. Ask your health care provider if you need to take a cholesterol-lowering medicine (statin). Find healthy ways to manage stress, such as: Meditation, yoga, or listening to music. Journaling. Talking to a trusted person. Spending time with friends and family. Safety Always wear your seat belt while driving or riding in a vehicle. Do not drive: If you have been drinking alcohol. Do not ride with someone who has been drinking. When you are tired or distracted. While texting. If you have been using any mind-altering substances or drugs. Wear a helmet and other protective equipment during sports activities. If you have firearms in your house, make sure you follow all gun safety procedures. Minimize exposure to UV radiation to reduce your risk of skin cancer. What's next? Visit your health care provider once a year for an annual wellness visit. Ask your health care provider how often you should have your eyes and teeth checked. Stay up to date on all vaccines. This information is not intended to replace advice given to you by your health care provider. Make sure you discuss any questions you have with your health care provider. Document Revised: 03/15/2021 Document Reviewed: 03/15/2021 Elsevier Patient Education  [place].

## 2022-03-29 NOTE — Assessment & Plan Note (Signed)
Left hip flared but continues to do TaiChi and stay active

## 2022-03-29 NOTE — Assessment & Plan Note (Signed)
Encouraged complete cessation. He only smokes 1 cigar a day.

## 2022-03-29 NOTE — Assessment & Plan Note (Signed)
Seeds implanted in February, has had to use a in and out cath at night but is able to urinate better. Will continue to follow with Urology q 6 months

## 2022-03-29 NOTE — Assessment & Plan Note (Signed)
Encourage heart healthy diet such as MIND or DASH diet, increase exercise, avoid trans fats, simple carbohydrates and processed foods, consider a krill or fish or flaxseed oil cap daily.  °

## 2022-03-29 NOTE — Assessment & Plan Note (Signed)
Stable

## 2022-03-29 NOTE — Assessment & Plan Note (Signed)
hgba1c acceptable, minimize simple carbs. Increase exercise as tolerated. Continue current meds 

## 2022-03-30 ENCOUNTER — Other Ambulatory Visit (INDEPENDENT_AMBULATORY_CARE_PROVIDER_SITE_OTHER): Payer: Medicare Other

## 2022-03-30 ENCOUNTER — Other Ambulatory Visit: Payer: Self-pay | Admitting: *Deleted

## 2022-03-30 DIAGNOSIS — D539 Nutritional anemia, unspecified: Secondary | ICD-10-CM

## 2022-03-30 LAB — VITAMIN B12: Vitamin B-12: 343 pg/mL (ref 211–911)

## 2022-03-30 NOTE — Progress Notes (Signed)
b12

## 2022-04-10 ENCOUNTER — Other Ambulatory Visit (INDEPENDENT_AMBULATORY_CARE_PROVIDER_SITE_OTHER): Payer: Medicare Other

## 2022-04-10 DIAGNOSIS — R7309 Other abnormal glucose: Secondary | ICD-10-CM | POA: Diagnosis not present

## 2022-04-10 DIAGNOSIS — Z1211 Encounter for screening for malignant neoplasm of colon: Secondary | ICD-10-CM | POA: Diagnosis not present

## 2022-04-10 LAB — MICROALBUMIN / CREATININE URINE RATIO
Creatinine,U: 72.9 mg/dL
Microalb Creat Ratio: 7.9 mg/g (ref 0.0–30.0)
Microalb, Ur: 5.8 mg/dL — ABNORMAL HIGH (ref 0.0–1.9)

## 2022-04-20 LAB — COLOGUARD: COLOGUARD: NEGATIVE

## 2022-06-08 DIAGNOSIS — N183 Chronic kidney disease, stage 3 unspecified: Secondary | ICD-10-CM | POA: Diagnosis not present

## 2022-06-08 DIAGNOSIS — I10 Essential (primary) hypertension: Secondary | ICD-10-CM | POA: Diagnosis not present

## 2022-06-12 DIAGNOSIS — I10 Essential (primary) hypertension: Secondary | ICD-10-CM | POA: Diagnosis not present

## 2022-06-12 DIAGNOSIS — N183 Chronic kidney disease, stage 3 unspecified: Secondary | ICD-10-CM | POA: Diagnosis not present

## 2022-06-12 DIAGNOSIS — N390 Urinary tract infection, site not specified: Secondary | ICD-10-CM | POA: Diagnosis not present

## 2022-06-12 DIAGNOSIS — N2589 Other disorders resulting from impaired renal tubular function: Secondary | ICD-10-CM | POA: Diagnosis not present

## 2022-06-14 DIAGNOSIS — R338 Other retention of urine: Secondary | ICD-10-CM | POA: Diagnosis not present

## 2022-06-19 ENCOUNTER — Telehealth: Payer: Self-pay | Admitting: Family Medicine

## 2022-06-19 NOTE — Telephone Encounter (Signed)
Wayna Chalet called stating she was giving a curtesy call to [name] to let her know that the patient had a quantofaron done and he has moderate white PAD. Seh did not disclose any more information and stated again that it was a house call.

## 2022-08-06 DIAGNOSIS — Z859 Personal history of malignant neoplasm, unspecified: Secondary | ICD-10-CM | POA: Diagnosis not present

## 2022-08-06 DIAGNOSIS — Z129 Encounter for screening for malignant neoplasm, site unspecified: Secondary | ICD-10-CM | POA: Diagnosis not present

## 2022-08-06 DIAGNOSIS — D0462 Carcinoma in situ of skin of left upper limb, including shoulder: Secondary | ICD-10-CM | POA: Diagnosis not present

## 2022-08-06 DIAGNOSIS — L570 Actinic keratosis: Secondary | ICD-10-CM | POA: Diagnosis not present

## 2022-08-06 DIAGNOSIS — D485 Neoplasm of uncertain behavior of skin: Secondary | ICD-10-CM | POA: Diagnosis not present

## 2022-08-06 DIAGNOSIS — D0439 Carcinoma in situ of skin of other parts of face: Secondary | ICD-10-CM | POA: Diagnosis not present

## 2022-09-21 ENCOUNTER — Other Ambulatory Visit: Payer: Self-pay | Admitting: Family Medicine

## 2022-12-06 DIAGNOSIS — N183 Chronic kidney disease, stage 3 unspecified: Secondary | ICD-10-CM | POA: Diagnosis not present

## 2022-12-11 DIAGNOSIS — R350 Frequency of micturition: Secondary | ICD-10-CM | POA: Diagnosis not present

## 2022-12-11 DIAGNOSIS — I10 Essential (primary) hypertension: Secondary | ICD-10-CM | POA: Diagnosis not present

## 2022-12-11 DIAGNOSIS — N390 Urinary tract infection, site not specified: Secondary | ICD-10-CM | POA: Diagnosis not present

## 2022-12-11 DIAGNOSIS — N2589 Other disorders resulting from impaired renal tubular function: Secondary | ICD-10-CM | POA: Diagnosis not present

## 2022-12-11 DIAGNOSIS — R338 Other retention of urine: Secondary | ICD-10-CM | POA: Diagnosis not present

## 2022-12-11 DIAGNOSIS — N183 Chronic kidney disease, stage 3 unspecified: Secondary | ICD-10-CM | POA: Diagnosis not present

## 2023-01-14 ENCOUNTER — Encounter: Payer: Self-pay | Admitting: *Deleted

## 2023-03-29 ENCOUNTER — Other Ambulatory Visit: Payer: Self-pay | Admitting: Family Medicine

## 2023-04-03 DIAGNOSIS — R339 Retention of urine, unspecified: Secondary | ICD-10-CM | POA: Diagnosis not present

## 2023-04-03 DIAGNOSIS — N35919 Unspecified urethral stricture, male, unspecified site: Secondary | ICD-10-CM | POA: Diagnosis not present

## 2023-04-11 ENCOUNTER — Encounter: Payer: Medicare Other | Admitting: Family Medicine

## 2023-04-17 ENCOUNTER — Ambulatory Visit: Payer: Medicare Other | Admitting: Emergency Medicine

## 2023-04-17 VITALS — Ht 68.0 in | Wt 188.0 lb

## 2023-04-17 DIAGNOSIS — Z0000 Encounter for general adult medical examination without abnormal findings: Secondary | ICD-10-CM | POA: Diagnosis not present

## 2023-04-17 NOTE — Patient Instructions (Signed)
Edward Hernandez , Thank you for taking time to come for your Medicare Wellness Visit. I appreciate your ongoing commitment to your health goals. Please review the following plan we discussed and let me know if I can assist you in the future.   These are the goals we discussed:  Goals       Exercise 5x per week (60 min per time) (pt-stated)        This is a list of the screening recommended for you and due dates:  Health Maintenance  Topic Date Due   Zoster (Shingles) Vaccine (2 of 2) 05/30/2021   COVID-19 Vaccine (4 - 2023-24 season) 06/01/2022   Yearly kidney function blood test for diabetes  03/30/2023   Yearly kidney health urinalysis for diabetes  04/11/2023   Flu Shot  05/02/2023   Medicare Annual Wellness Visit  04/16/2024   DTaP/Tdap/Td vaccine (2 - Tdap) 12/01/2025   Pneumonia Vaccine  Completed   HPV Vaccine  Aged Out   Complete foot exam   Discontinued   Hemoglobin A1C  Discontinued   Eye exam for diabetics  Discontinued   Hepatitis C Screening  Discontinued    Advanced directives: on file  Conditions/risks identified: Get Shingrix at your local pharmacy. Discuss with your PCP about low dose CT Scan for lung cancer screening.  Next appointment: Follow up in one year for your annual wellness visit. 04/22/24  Preventive Care 65 Years and Older, Male  Preventive care refers to lifestyle choices and visits with your health care provider that can promote health and wellness. What does preventive care include? A yearly physical exam. This is also called an annual well check. Dental exams once or twice a year. Routine eye exams. Ask your health care provider how often you should have your eyes checked. Personal lifestyle choices, including: Daily care of your teeth and gums. Regular physical activity. Eating a healthy diet. Avoiding tobacco and drug use. Limiting alcohol use. Practicing safe sex. Taking low doses of aspirin every day. Taking vitamin and mineral  supplements as recommended by your health care provider. What happens during an annual well check? The services and screenings done by your health care provider during your annual well check will depend on your age, overall health, lifestyle risk factors, and family history of disease. Counseling  Your health care provider may ask you questions about your: Alcohol use. Tobacco use. Drug use. Emotional well-being. Home and relationship well-being. Sexual activity. Eating habits. History of falls. Memory and ability to understand (cognition). Work and work Astronomer. Screening  You may have the following tests or measurements: Height, weight, and BMI. Blood pressure. Lipid and cholesterol levels. These may be checked every 5 years, or more frequently if you are over 50 years old. Skin check. Lung cancer screening. You may have this screening every year starting at age 54 if you have a 30-pack-year history of smoking and currently smoke or have quit within the past 15 years. Fecal occult blood test (FOBT) of the stool. You may have this test every year starting at age 77. Flexible sigmoidoscopy or colonoscopy. You may have a sigmoidoscopy every 5 years or a colonoscopy every 10 years starting at age 62. Prostate cancer screening. Recommendations will vary depending on your family history and other risks. Hepatitis C blood test. Hepatitis B blood test. Sexually transmitted disease (STD) testing. Diabetes screening. This is done by checking your blood sugar (glucose) after you have not eaten for a while (fasting). You may have this  done every 1-3 years. Abdominal aortic aneurysm (AAA) screening. You may need this if you are a current or former smoker. Osteoporosis. You may be screened starting at age 48 if you are at high risk. Talk with your health care provider about your test results, treatment options, and if necessary, the need for more tests. Vaccines  Your health care provider  may recommend certain vaccines, such as: Influenza vaccine. This is recommended every year. Tetanus, diphtheria, and acellular pertussis (Tdap, Td) vaccine. You may need a Td booster every 10 years. Zoster vaccine. You may need this after age 6. Pneumococcal 13-valent conjugate (PCV13) vaccine. One dose is recommended after age 52. Pneumococcal polysaccharide (PPSV23) vaccine. One dose is recommended after age 56. Talk to your health care provider about which screenings and vaccines you need and how often you need them. This information is not intended to replace advice given to you by your health care provider. Make sure you discuss any questions you have with your health care provider. Document Released: 10/14/2015 Document Revised: 06/06/2016 Document Reviewed: 07/19/2015 Elsevier Interactive Patient Education  2017 [employer].  Fall Prevention in the Home Falls can cause injuries. They can happen to people of all ages. There are many things you can do to make your home safe and to help prevent falls. What can I do on the outside of my home? Regularly fix the edges of walkways and driveways and fix any cracks. Remove anything that might make you trip as you walk through a door, such as a raised step or threshold. Trim any bushes or trees on the path to your home. Use bright outdoor lighting. Clear any walking paths of anything that might make someone trip, such as rocks or tools. Regularly check to see if handrails are loose or broken. Make sure that both sides of any steps have handrails. Any raised decks and porches should have guardrails on the edges. Have any leaves, snow, or ice cleared regularly. Use sand or salt on walking paths during winter. Clean up any spills in your garage right away. This includes oil or grease spills. What can I do in the bathroom? Use night lights. Install grab bars by the toilet and in the tub and shower. Do not use towel bars as grab bars. Use  non-skid mats or decals in the tub or shower. If you need to sit down in the shower, use a plastic, non-slip stool. Keep the floor dry. Clean up any water that spills on the floor as soon as it happens. Remove soap buildup in the tub or shower regularly. Attach bath mats securely with double-sided non-slip rug tape. Do not have throw rugs and other things on the floor that can make you trip. What can I do in the bedroom? Use night lights. Make sure that you have a light by your bed that is easy to reach. Do not use any sheets or blankets that are too big for your bed. They should not hang down onto the floor. Have a firm chair that has side arms. You can use this for support while you get dressed. Do not have throw rugs and other things on the floor that can make you trip. What can I do in the kitchen? Clean up any spills right away. Avoid walking on wet floors. Keep items that you use a lot in easy-to-reach places. If you need to reach something above you, use a strong step stool that has a grab bar. Keep electrical cords out of  the way. Do not use floor polish or wax that makes floors slippery. If you must use wax, use non-skid floor wax. Do not have throw rugs and other things on the floor that can make you trip. What can I do with my stairs? Do not leave any items on the stairs. Make sure that there are handrails on both sides of the stairs and use them. Fix handrails that are broken or loose. Make sure that handrails are as long as the stairways. Check any carpeting to make sure that it is firmly attached to the stairs. Fix any carpet that is loose or worn. Avoid having throw rugs at the top or bottom of the stairs. If you do have throw rugs, attach them to the floor with carpet tape. Make sure that you have a light switch at the top of the stairs and the bottom of the stairs. If you do not have them, ask someone to add them for you. What else can I do to help prevent falls? Wear  shoes that: Do not have high heels. Have rubber bottoms. Are comfortable and fit you well. Are closed at the toe. Do not wear sandals. If you use a stepladder: Make sure that it is fully opened. Do not climb a closed stepladder. Make sure that both sides of the stepladder are locked into place. Ask someone to hold it for you, if possible. Clearly mark and make sure that you can see: Any grab bars or handrails. First and last steps. Where the edge of each step is. Use tools that help you move around (mobility aids) if they are needed. These include: Canes. Walkers. Scooters. Crutches. Turn on the lights when you go into a dark area. Replace any light bulbs as soon as they burn out. Set up your furniture so you have a clear path. Avoid moving your furniture around. If any of your floors are uneven, fix them. If there are any pets around you, be aware of where they are. Review your medicines with your doctor. Some medicines can make you feel dizzy. This can increase your chance of falling. Ask your doctor what other things that you can do to help prevent falls. This information is not intended to replace advice given to you by your health care provider. Make sure you discuss any questions you have with your health care provider. Document Released: 07/14/2009 Document Revised: 02/23/2016 Document Reviewed: 10/22/2014 Elsevier Interactive Patient Education  2017 [employer].
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
## 2023-04-28 NOTE — Assessment & Plan Note (Signed)
Well controlled, no changes to meds. Encouraged heart healthy diet such as the DASH diet and exercise as tolerated.  °

## 2023-04-28 NOTE — Assessment & Plan Note (Signed)
Encourage heart healthy diet such as MIND or DASH diet, increase exercise, avoid trans fats, simple carbohydrates and processed foods, consider a krill or fish or flaxseed oil cap daily.  °

## 2023-04-28 NOTE — Assessment & Plan Note (Signed)
Patient encouraged to maintain heart healthy diet, regular exercise, adequate sleep. Consider daily probiotics. Take medications as prescribed.  Labs reviewed. Cologuard 2023 has aged out of screening for colon cancer.

## 2023-04-28 NOTE — Assessment & Plan Note (Signed)
hgba1c acceptable, minimize simple carbs. Increase exercise as tolerated. Continue current meds 

## 2023-04-28 NOTE — Assessment & Plan Note (Signed)
Hydrate and monitor 
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
## 2023-04-29 ENCOUNTER — Ambulatory Visit: Payer: Medicare Other | Admitting: Family Medicine

## 2023-04-29 ENCOUNTER — Encounter: Payer: Self-pay | Admitting: Family Medicine

## 2023-04-29 VITALS — BP 128/72 | HR 60 | Temp 97.7°F | Resp 16 | Ht 68.0 in | Wt 193.2 lb

## 2023-04-29 DIAGNOSIS — E782 Mixed hyperlipidemia: Secondary | ICD-10-CM

## 2023-04-29 DIAGNOSIS — N1832 Chronic kidney disease, stage 3b: Secondary | ICD-10-CM

## 2023-04-29 DIAGNOSIS — Z0000 Encounter for general adult medical examination without abnormal findings: Secondary | ICD-10-CM

## 2023-04-29 DIAGNOSIS — R7309 Other abnormal glucose: Secondary | ICD-10-CM

## 2023-04-29 DIAGNOSIS — I10 Essential (primary) hypertension: Secondary | ICD-10-CM | POA: Diagnosis not present

## 2023-04-29 NOTE — Patient Instructions (Addendum)
Magnesium Glycinate 200-400 mg at bedtime L Tryptophan capsules for sleep 60-80 ounces of fluids daily 4000 steps, 8000 steps for health  Get second Shingrix shot Tetanus in 2027 for prevention or earlier if injured  RSV, Respiratory Syncitial Virus vaccine, Arexvy vaccine at pharmacy  Flu and COVID boosters in the fall+  Preventive Care 65 Years and Older, Male Preventive care refers to lifestyle choices and visits with your health care provider that can promote health and wellness. Preventive care visits are also called wellness exams. What can I expect for my preventive care visit? Counseling During your preventive care visit, your health care provider may ask about your: Medical history, including: Past medical problems. Family medical history. History of falls. Current health, including: Emotional well-being. Home life and relationship well-being. Sexual activity. Memory and ability to understand (cognition). Lifestyle, including: Alcohol, nicotine or tobacco, and drug use. Access to firearms. Diet, exercise, and sleep habits. Work and work Astronomer. Sunscreen use. Safety issues such as seatbelt and bike helmet use. Physical exam Your health care provider will check your: Height and weight. These may be used to calculate your BMI (body mass index). BMI is a measurement that tells if you are at a healthy weight. Waist circumference. This measures the distance around your waistline. This measurement also tells if you are at a healthy weight and may help predict your risk of certain diseases, such as type 2 diabetes and high blood pressure. Heart rate and blood pressure. Body temperature. Skin for abnormal spots. What immunizations do I need?  Vaccines are usually given at various ages, according to a schedule. Your health care provider will recommend vaccines for you based on your age, medical history, and lifestyle or other factors, such as travel or where you  work. What tests do I need? Screening Your health care provider may recommend screening tests for certain conditions. This may include: Lipid and cholesterol levels. Diabetes screening. This is done by checking your blood sugar (glucose) after you have not eaten for a while (fasting). Hepatitis C test. Hepatitis B test. HIV (human immunodeficiency virus) test. STI (sexually transmitted infection) testing, if you are at risk. Lung cancer screening. Colorectal cancer screening. Prostate cancer screening. Abdominal aortic aneurysm (AAA) screening. You may need this if you are a current or former smoker. Talk with your health care provider about your test results, treatment options, and if necessary, the need for more tests. Follow these instructions at home: Eating and drinking  Eat a diet that includes fresh fruits and vegetables, whole grains, lean protein, and low-fat dairy products. Limit your intake of foods with high amounts of sugar, saturated fats, and salt. Take vitamin and mineral supplements as recommended by your health care provider. Do not drink alcohol if your health care provider tells you not to drink. If you drink alcohol: Limit how much you have to 0-2 drinks a day. Know how much alcohol is in your drink. In the [place] one drink equals one 12 oz bottle of beer (355 mL), one 5 oz glass of wine (148 mL), or one 1 oz glass of hard liquor (44 mL). Lifestyle Brush your teeth every morning and night with fluoride toothpaste. Floss one time each day. Exercise for at least 30 minutes 5 or more days each week. Do not use any products that contain nicotine or tobacco. These products include cigarettes, chewing tobacco, and vaping devices, such as e-cigarettes. If you need help quitting, ask your health care provider. Do not use drugs. If  you are sexually active, practice safe sex. Use a condom or other form of protection to prevent STIs. Take aspirin only as told by your health  care provider. Make sure that you understand how much to take and what form to take. Work with your health care provider to find out whether it is safe and beneficial for you to take aspirin daily. Ask your health care provider if you need to take a cholesterol-lowering medicine (statin). Find healthy ways to manage stress, such as: Meditation, yoga, or listening to music. Journaling. Talking to a trusted person. Spending time with friends and family. Safety Always wear your seat belt while driving or riding in a vehicle. Do not drive: If you have been drinking alcohol. Do not ride with someone who has been drinking. When you are tired or distracted. While texting. If you have been using any mind-altering substances or drugs. Wear a helmet and other protective equipment during sports activities. If you have firearms in your house, make sure you follow all gun safety procedures. Minimize exposure to UV radiation to reduce your risk of skin cancer. What's next? Visit your health care provider once a year for an annual wellness visit. Ask your health care provider how often you should have your eyes and teeth checked. Stay up to date on all vaccines. This information is not intended to replace advice given to you by your health care provider. Make sure you discuss any questions you have with your health care provider. Document Revised: 03/15/2021 Document Reviewed: 03/15/2021 Elsevier Patient Education  2024 [employer].

## 2023-04-30 ENCOUNTER — Other Ambulatory Visit (INDEPENDENT_AMBULATORY_CARE_PROVIDER_SITE_OTHER): Payer: Medicare Other

## 2023-04-30 DIAGNOSIS — R7309 Other abnormal glucose: Secondary | ICD-10-CM | POA: Diagnosis not present

## 2023-04-30 NOTE — Progress Notes (Signed)
[id number] 

## 2023-05-06 ENCOUNTER — Other Ambulatory Visit: Payer: Self-pay

## 2023-05-06 MED ORDER — VITAMIN D (ERGOCALCIFEROL) 1.25 MG (50000 UNIT) PO CAPS: 50000.0000 [IU] | ORAL_CAPSULE | ORAL | 3 refills | Status: DC

## 2023-06-07 DIAGNOSIS — N183 Chronic kidney disease, stage 3 unspecified: Secondary | ICD-10-CM | POA: Diagnosis not present

## 2023-06-11 DIAGNOSIS — R339 Retention of urine, unspecified: Secondary | ICD-10-CM | POA: Diagnosis not present

## 2023-06-11 DIAGNOSIS — R3914 Feeling of incomplete bladder emptying: Secondary | ICD-10-CM | POA: Diagnosis not present

## 2023-06-11 DIAGNOSIS — I10 Essential (primary) hypertension: Secondary | ICD-10-CM | POA: Diagnosis not present

## 2023-06-11 DIAGNOSIS — N183 Chronic kidney disease, stage 3 unspecified: Secondary | ICD-10-CM | POA: Diagnosis not present

## 2023-06-11 DIAGNOSIS — N390 Urinary tract infection, site not specified: Secondary | ICD-10-CM | POA: Diagnosis not present

## 2023-06-24 DIAGNOSIS — R339 Retention of urine, unspecified: Secondary | ICD-10-CM | POA: Diagnosis not present

## 2023-06-24 DIAGNOSIS — N35919 Unspecified urethral stricture, male, unspecified site: Secondary | ICD-10-CM | POA: Diagnosis not present

## 2023-07-02 ENCOUNTER — Telehealth: Payer: Self-pay

## 2023-07-02 NOTE — Patient Outreach (Signed)
Care Coordination   07/02/2023 Name: Edward Hernandez MRN: [id number] DOB: 02/15/1941   Care Coordination Outreach Attempts:  An unsuccessful telephone outreach was attempted today to offer the patient information about available care coordination services.  Follow Up Plan:  Additional outreach attempts will be made to offer the patient care coordination information and services.   Encounter Outcome:  No Answer   Care Coordination Interventions:  No, not indicated    Kathyrn Sheriff, RN, MSN, BSN, CCM Care Management Coordinator [phone]

## 2023-07-29 ENCOUNTER — Telehealth: Payer: Self-pay

## 2023-07-29 NOTE — Patient Outreach (Signed)
Care Coordination   07/29/2023 Name: WALES LARRISON MRN: [id number] DOB: October 06, 1940   Care Coordination Outreach Attempts:  A second unsuccessful outreach was attempted today to offer the patient with information about available care coordination services.  Follow Up Plan:  Additional outreach attempts will be made to offer the patient care coordination information and services.   Encounter Outcome:  No Answer   Care Coordination Interventions:  No, not indicated    Kathyrn Sheriff, RN, MSN, BSN, CCM Care Management Coordinator [phone]

## 2023-08-12 ENCOUNTER — Telehealth: Payer: Self-pay

## 2023-08-12 NOTE — Patient Outreach (Signed)
  Care Coordination   08/12/2023 Name: CANON WYKLE MRN: [id number] DOB: April 27, 1941   Care Coordination Outreach Attempts:  A third unsuccessful outreach was attempted today to offer the patient with information about available care coordination services.  Follow Up Plan:  No further outreach attempts will be made at this time. We have been unable to contact the patient to offer or enroll patient in care coordination services  Encounter Outcome:  No Answer   Care Coordination Interventions:  No, not indicated    Kathyrn Sheriff, RN, MSN, BSN, CCM Care Management Coordinator [phone]

## 2023-08-18 NOTE — Assessment & Plan Note (Signed)
No recent progression.

## 2023-08-18 NOTE — Assessment & Plan Note (Signed)
hgba1c acceptable, minimize simple carbs. Increase exercise as tolerated. Continue current meds 

## 2023-08-18 NOTE — Assessment & Plan Note (Signed)
Well controlled, no changes to meds. Encouraged heart healthy diet such as the DASH diet and exercise as tolerated.  °

## 2023-08-18 NOTE — Assessment & Plan Note (Signed)
Encouraged DASH diet, decrease po intake and increase exercise as tolerated. Needs 7-8 hours of sleep nightly. Avoid trans fats, eat small, frequent meals every 4-5 hours with lean proteins, complex carbs and healthy fats. Minimize simple carbs 

## 2023-08-18 NOTE — Assessment & Plan Note (Signed)
Hydrate and monitor 

## 2023-08-18 NOTE — Assessment & Plan Note (Signed)
Encourage heart healthy diet such as MIND or DASH diet, increase exercise, avoid trans fats, simple carbohydrates and processed foods, consider a krill or fish or flaxseed oil cap daily.  °

## 2023-08-20 ENCOUNTER — Ambulatory Visit (INDEPENDENT_AMBULATORY_CARE_PROVIDER_SITE_OTHER): Payer: Medicare Other | Admitting: Family Medicine

## 2023-08-20 ENCOUNTER — Encounter: Payer: Self-pay | Admitting: Family Medicine

## 2023-08-20 VITALS — BP 142/80 | HR 65 | Temp 98.3°F | Resp 16 | Ht 66.0 in | Wt 191.0 lb

## 2023-08-20 DIAGNOSIS — I10 Essential (primary) hypertension: Secondary | ICD-10-CM | POA: Diagnosis not present

## 2023-08-20 DIAGNOSIS — E559 Vitamin D deficiency, unspecified: Secondary | ICD-10-CM | POA: Diagnosis not present

## 2023-08-20 DIAGNOSIS — G63 Polyneuropathy in diseases classified elsewhere: Secondary | ICD-10-CM

## 2023-08-20 DIAGNOSIS — R7309 Other abnormal glucose: Secondary | ICD-10-CM | POA: Diagnosis not present

## 2023-08-20 DIAGNOSIS — E782 Mixed hyperlipidemia: Secondary | ICD-10-CM

## 2023-08-20 DIAGNOSIS — E6609 Other obesity due to excess calories: Secondary | ICD-10-CM

## 2023-08-20 DIAGNOSIS — N1832 Chronic kidney disease, stage 3b: Secondary | ICD-10-CM

## 2023-08-20 NOTE — Patient Instructions (Signed)
Shingrix is the new shingles shot, 2 shots over 2-6 months, confirm coverage with insurance and document, then can return here for shots with nurse appt or at pharmacy  

## 2023-08-20 NOTE — Assessment & Plan Note (Signed)
Vitamin D3 1000 international units daily Supplement and monitor
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
## 2023-08-21 LAB — COMPREHENSIVE METABOLIC PANEL
ALT: 21 U/L (ref 0–53)
AST: 23 U/L (ref 0–37)
Albumin: 4.8 g/dL (ref 3.5–5.2)
Alkaline Phosphatase: 70 U/L (ref 39–117)
BUN: 40 mg/dL — ABNORMAL HIGH (ref 6–23)
CO2: 26 meq/L (ref 19–32)
Calcium: 10.1 mg/dL (ref 8.4–10.5)
Chloride: 109 meq/L (ref 96–112)
Creatinine, Ser: 2.15 mg/dL — ABNORMAL HIGH (ref 0.40–1.50)
GFR: 28.21 mL/min — ABNORMAL LOW (ref >60.00)
Glucose, Bld: 93 mg/dL (ref 70–99)
Potassium: 4.9 meq/L (ref 3.5–5.1)
Sodium: 143 meq/L (ref 135–145)
Total Bilirubin: 0.8 mg/dL (ref 0.2–1.2)
Total Protein: 7 g/dL (ref 6.0–8.3)

## 2023-08-21 LAB — CBC WITH DIFFERENTIAL/PLATELET
Basophils Absolute: 0.1 10*3/uL (ref 0.0–0.1)
Basophils Relative: 0.8 % (ref 0.0–3.0)
Eosinophils Absolute: 0 10*3/uL (ref 0.0–0.7)
Eosinophils Relative: 0.5 % (ref 0.0–5.0)
HCT: 42.4 % (ref 39.0–52.0)
Hemoglobin: 14 g/dL (ref 13.0–17.0)
Lymphocytes Relative: 34.3 % (ref 12.0–46.0)
Lymphs Abs: 2.2 10*3/uL (ref 0.7–4.0)
MCHC: 33.1 g/dL (ref 30.0–36.0)
MCV: 93.9 fL (ref 78.0–100.0)
Monocytes Absolute: 1.5 10*3/uL — ABNORMAL HIGH (ref 0.1–1.0)
Monocytes Relative: 23.1 % — ABNORMAL HIGH (ref 3.0–12.0)
Neutro Abs: 2.6 10*3/uL (ref 1.4–7.7)
Neutrophils Relative %: 41.3 % — ABNORMAL LOW (ref 43.0–77.0)
Platelets: 127 10*3/uL — ABNORMAL LOW (ref 150.0–400.0)
RBC: 4.51 Mil/uL (ref 4.22–5.81)
RDW: 14.7 % (ref 11.5–15.5)
WBC: 6.3 10*3/uL (ref 4.0–10.5)

## 2023-08-21 LAB — LIPID PANEL
Cholesterol: 165 mg/dL (ref 0–200)
HDL: 38.6 mg/dL — ABNORMAL LOW (ref >39.00)
LDL Cholesterol: 84 mg/dL (ref 0–99)
NonHDL: 126.33
Total CHOL/HDL Ratio: 4
Triglycerides: 210 mg/dL — ABNORMAL HIGH (ref 0.0–149.0)
VLDL: 42 mg/dL — ABNORMAL HIGH (ref 0.0–40.0)

## 2023-08-21 LAB — MICROALBUMIN / CREATININE URINE RATIO
Creatinine,U: 61.4 mg/dL
Microalb Creat Ratio: 22.1 mg/g (ref 0.0–30.0)
Microalb, Ur: 13.6 mg/dL — ABNORMAL HIGH (ref 0.0–1.9)

## 2023-08-21 LAB — HEMOGLOBIN A1C: Hgb A1c MFr Bld: 5.9 % (ref 4.6–6.5)

## 2023-08-21 LAB — TSH: TSH: 1.12 u[IU]/mL (ref 0.35–5.50)

## 2023-08-21 LAB — VITAMIN D 25 HYDROXY (VIT D DEFICIENCY, FRACTURES): VITD: 32.68 ng/mL (ref 30.00–100.00)

## 2023-09-22 ENCOUNTER — Other Ambulatory Visit: Payer: Self-pay | Admitting: Family Medicine

## 2023-09-26 DIAGNOSIS — C44619 Basal cell carcinoma of skin of left upper limb, including shoulder: Secondary | ICD-10-CM | POA: Diagnosis not present

## 2023-09-26 DIAGNOSIS — D0439 Carcinoma in situ of skin of other parts of face: Secondary | ICD-10-CM | POA: Diagnosis not present

## 2023-09-26 DIAGNOSIS — C44719 Basal cell carcinoma of skin of left lower limb, including hip: Secondary | ICD-10-CM | POA: Diagnosis not present

## 2023-09-26 DIAGNOSIS — D0462 Carcinoma in situ of skin of left upper limb, including shoulder: Secondary | ICD-10-CM | POA: Diagnosis not present

## 2023-09-26 DIAGNOSIS — L570 Actinic keratosis: Secondary | ICD-10-CM | POA: Diagnosis not present

## 2023-09-26 DIAGNOSIS — Z129 Encounter for screening for malignant neoplasm, site unspecified: Secondary | ICD-10-CM | POA: Diagnosis not present

## 2023-10-07 ENCOUNTER — Ambulatory Visit: Payer: Self-pay | Admitting: Licensed Clinical Social Worker

## 2023-10-07 NOTE — Patient Instructions (Signed)
 Visit Information  Thank you for taking time to visit with me today. Please don't hesitate to contact me if I can be of assistance to you.   Following are the goals we discussed today:   Goals Addressed             This Visit's Progress    patient stated he does Tai Chi to help manaage stress issues       Interventions: Spoke via phone with client about program services. Discussed RN, LCSW and Pharmacist  support with program Discussed client support with PCP , Dr. Harlene Horton Client said he had his prescribed medications. He said he was doing well. He said he had an appointment scheduled with PCP in March of 2024 Client said he does Tai Chi to help him relax and manage stress issues. 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  Client did not mention any mood problems. He did not mention any stress issues  Client said he did not feel that he needed program support at present. LCSW mentioned to client that he could talk with PCP in the future if he felt that program support might be helpful to client.  Client was appreciative of call from LCSW today. He was glad to receive information about program support services       No follow up calls needed at this time. Program information provided to client  Please call the care guide team at [phone] if you need to cancel or reschedule your appointment.   If you are experiencing a Mental Health or Behavioral Health Crisis or need someone to talk to, please go to [facility] [address] [place] [phone])   The patient verbalized understanding of instructions, educational materials, and care plan provided today and DECLINED offer to receive copy of patient instructions, educational materials, and care plan.   The patient has been provided with contact information for the care management team and has been advised to call with any health related questions or concerns.   Ozell RAMAN.Sundae Maners MSW,  LCSW Licensed Visual Merchandiser [facility] Care Management [phone]

## 2023-10-07 NOTE — Patient Outreach (Signed)
  Care Coordination   Initial Visit Note   10/07/2023 Name: Edward Hernandez MRN: [id number] DOB: May 23, 1941  Edward Hernandez is a 83 y.o. year old male who sees Domenica Harlene LABOR, MD for primary care. I spoke with  Edward Hernandez by phone today.  What matters to the patients health and wellness today?  Patient stated he does Tai Chi to help manage stress issues    Goals Addressed             This Visit's Progress    patient stated he does Tai Chi to help manaage stress issues       Interventions: Spoke via phone with client about program services. Discussed RN, LCSW and Pharmacist  support with program Discussed client support with PCP , Dr. Harlene Domenica Client said he had his prescribed medications. He said he was doing well. He said he had an appointment scheduled with PCP in March of 2024 Client said he does Tai Chi to help him relax and manage stress issues. 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  Client did not mention any mood problems. He did not mention any stress issues  Client said he did not feel that he needed program support at present. LCSW mentioned to client that he could talk with PCP in the future if he felt that program support might be helpful to client.  Client was appreciative of call from LCSW today. He was glad to receive information about program support services        SDOH assessments and interventions completed:  Yes  SDOH Interventions Today    Flowsheet Row Most Recent Value  SDOH Interventions   Stress Interventions Provide Counseling  [participates in Tai Chi to manage stress issues]        Care Coordination Interventions:  Yes, provided   Interventions Today    Flowsheet Row Most Recent Value  Chronic Disease   Chronic disease during today's visit Other  [spoke with client about client needs]  General Interventions   General Interventions Discussed/Reviewed General Interventions Discussed, Community Resources  Education Interventions    Education Provided Provided Education  Provided Verbal Education On [employer]  Mental Health Interventions   Mental Health Discussed/Reviewed Coping Strategies  [no mood issues noted]  Pharmacy Interventions   Pharmacy Dicussed/Reviewed Pharmacy Topics Discussed  Safety Interventions   Safety Discussed/Reviewed Fall Risk        Follow up plan: No follow up calls needed at this time.  Program information provided to client   Encounter Outcome:  Patient Visit Completed   Ozell RAMAN.Ade Stmarie MSW, LCSW Licensed Visual Merchandiser [facility] Care Management [phone]

## 2023-12-05 DIAGNOSIS — E231 Drug-induced hypopituitarism: Secondary | ICD-10-CM | POA: Diagnosis not present

## 2023-12-05 DIAGNOSIS — E119 Type 2 diabetes mellitus without complications: Secondary | ICD-10-CM | POA: Diagnosis not present

## 2023-12-05 DIAGNOSIS — I10 Essential (primary) hypertension: Secondary | ICD-10-CM | POA: Diagnosis not present

## 2023-12-05 DIAGNOSIS — N183 Chronic kidney disease, stage 3 unspecified: Secondary | ICD-10-CM | POA: Diagnosis not present

## 2023-12-10 DIAGNOSIS — R339 Retention of urine, unspecified: Secondary | ICD-10-CM | POA: Diagnosis not present

## 2023-12-10 DIAGNOSIS — R8279 Other abnormal findings on microbiological examination of urine: Secondary | ICD-10-CM | POA: Diagnosis not present

## 2023-12-10 DIAGNOSIS — R350 Frequency of micturition: Secondary | ICD-10-CM | POA: Diagnosis not present

## 2023-12-10 DIAGNOSIS — I2510 Atherosclerotic heart disease of native coronary artery without angina pectoris: Secondary | ICD-10-CM | POA: Diagnosis not present

## 2023-12-10 DIAGNOSIS — N183 Chronic kidney disease, stage 3 unspecified: Secondary | ICD-10-CM | POA: Diagnosis not present

## 2023-12-10 DIAGNOSIS — N390 Urinary tract infection, site not specified: Secondary | ICD-10-CM | POA: Diagnosis not present

## 2023-12-10 DIAGNOSIS — I10 Essential (primary) hypertension: Secondary | ICD-10-CM | POA: Diagnosis not present

## 2024-01-30 ENCOUNTER — Other Ambulatory Visit: Payer: Self-pay | Admitting: Family Medicine

## 2024-02-03 DIAGNOSIS — L570 Actinic keratosis: Secondary | ICD-10-CM | POA: Diagnosis not present

## 2024-02-03 DIAGNOSIS — D0439 Carcinoma in situ of skin of other parts of face: Secondary | ICD-10-CM | POA: Diagnosis not present

## 2024-02-03 DIAGNOSIS — C44619 Basal cell carcinoma of skin of left upper limb, including shoulder: Secondary | ICD-10-CM | POA: Diagnosis not present

## 2024-02-03 DIAGNOSIS — C44629 Squamous cell carcinoma of skin of left upper limb, including shoulder: Secondary | ICD-10-CM | POA: Diagnosis not present

## 2024-02-03 DIAGNOSIS — C44719 Basal cell carcinoma of skin of left lower limb, including hip: Secondary | ICD-10-CM | POA: Diagnosis not present

## 2024-02-03 DIAGNOSIS — D0462 Carcinoma in situ of skin of left upper limb, including shoulder: Secondary | ICD-10-CM | POA: Diagnosis not present

## 2024-02-03 DIAGNOSIS — D485 Neoplasm of uncertain behavior of skin: Secondary | ICD-10-CM | POA: Diagnosis not present

## 2024-02-12 DIAGNOSIS — C44629 Squamous cell carcinoma of skin of left upper limb, including shoulder: Secondary | ICD-10-CM | POA: Diagnosis not present

## 2024-02-16 NOTE — Assessment & Plan Note (Signed)
 hgba1c acceptable, minimize simple carbs. Increase exercise as tolerated. Continue current meds

## 2024-02-16 NOTE — Assessment & Plan Note (Signed)
 Vitamin D3 1000 international units daily Supplement and monitor

## 2024-02-16 NOTE — Assessment & Plan Note (Signed)
 Well controlled, no changes to meds. Encouraged heart healthy diet such as the DASH diet and exercise as tolerated.

## 2024-02-16 NOTE — Assessment & Plan Note (Signed)
 Hydrate and monitor

## 2024-02-16 NOTE — Assessment & Plan Note (Signed)
 Encourage heart healthy diet such as MIND or DASH diet, increase exercise, avoid trans fats, simple carbohydrates and processed foods, consider a krill or fish or flaxseed oil cap daily.

## 2024-02-17 ENCOUNTER — Ambulatory Visit (INDEPENDENT_AMBULATORY_CARE_PROVIDER_SITE_OTHER): Payer: Medicare Other | Admitting: Family Medicine

## 2024-02-17 VITALS — BP 138/72 | HR 63 | Resp 16 | Ht 66.0 in | Wt 192.0 lb

## 2024-02-17 DIAGNOSIS — E782 Mixed hyperlipidemia: Secondary | ICD-10-CM

## 2024-02-17 DIAGNOSIS — F17200 Nicotine dependence, unspecified, uncomplicated: Secondary | ICD-10-CM

## 2024-02-17 DIAGNOSIS — I10 Essential (primary) hypertension: Secondary | ICD-10-CM | POA: Diagnosis not present

## 2024-02-17 DIAGNOSIS — N1832 Chronic kidney disease, stage 3b: Secondary | ICD-10-CM

## 2024-02-17 DIAGNOSIS — R7309 Other abnormal glucose: Secondary | ICD-10-CM | POA: Diagnosis not present

## 2024-02-17 DIAGNOSIS — E559 Vitamin D deficiency, unspecified: Secondary | ICD-10-CM | POA: Diagnosis not present
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
## 2024-02-17 NOTE — Patient Instructions (Signed)
 Shingrix is the new shingles shot, 2 shots over 2-6 months, confirm coverage with insurance and document, then can return here for shots with nurse appt or at pharmacy  RSV, Respiratory Syncital Virus Vaccine, Arexvy vaccine at pharmacy Tetanus is due in 2027 unless  you have injury Prevnar 20 vaccine at pharmacy 4000 steps minimum and greater than 8000 steps is best  Hypertension, Adult High blood pressure (hypertension) is when the force of blood pumping through the arteries is too strong. The arteries are the blood vessels that carry blood from the heart throughout the body. Hypertension forces the heart to work harder to pump blood and may cause arteries to become narrow or stiff. Untreated or uncontrolled hypertension can lead to a heart attack, heart failure, a stroke, kidney disease, and other problems. A blood pressure reading consists of a higher number over a lower number. Ideally, your blood pressure should be below 120/80. The first ("top") number is called the systolic pressure. It is a measure of the pressure in your arteries as your heart beats. The second ("bottom") number is called the diastolic pressure. It is a measure of the pressure in your arteries as the heart relaxes. What are the causes? The exact cause of this condition is not known. There are some conditions that result in high blood pressure. What increases the risk? Certain factors may make you more likely to develop high blood pressure. Some of these risk factors are under your control, including: Smoking. Not getting enough exercise or physical activity. Being overweight. Having too much fat, sugar, calories, or salt (sodium) in your diet. Drinking too much alcohol. Other risk factors include: Having a personal history of heart disease, diabetes, high cholesterol, or kidney disease. Stress. Having a family history of high blood pressure and high cholesterol. Having obstructive sleep apnea. Age. The risk increases  with age. What are the signs or symptoms? High blood pressure may not cause symptoms. Very high blood pressure (hypertensive crisis) may cause: Headache. Fast or irregular heartbeats (palpitations). Shortness of breath. Nosebleed. Nausea and vomiting. Vision changes. Severe chest pain, dizziness, and seizures. How is this diagnosed? This condition is diagnosed by measuring your blood pressure while you are seated, with your arm resting on a flat surface, your legs uncrossed, and your feet flat on the floor. The cuff of the blood pressure monitor will be placed directly against the skin of your upper arm at the level of your heart. Blood pressure should be measured at least twice using the same arm. Certain conditions can cause a difference in blood pressure between your right and left arms. If you have a high blood pressure reading during one visit or you have normal blood pressure with other risk factors, you may be asked to: Return on a different day to have your blood pressure checked again. Monitor your blood pressure at home for 1 week or longer. If you are diagnosed with hypertension, you may have other blood or imaging tests to help your health care provider understand your overall risk for other conditions. How is this treated? This condition is treated by making healthy lifestyle changes, such as eating healthy foods, exercising more, and reducing your alcohol intake. You may be referred for counseling on a healthy diet and physical activity. Your health care provider may prescribe medicine if lifestyle changes are not enough to get your blood pressure under control and if: Your systolic blood pressure is above 130. Your diastolic blood pressure is above 80. Your personal target  blood pressure may vary depending on your medical conditions, your age, and other factors. Follow these instructions at home: Eating and drinking  Eat a diet that is high in fiber and potassium, and low in  sodium, added sugar, and fat. An example of this eating plan is called the DASH diet. DASH stands for Dietary Approaches to Stop Hypertension. To eat this way: Eat plenty of fresh fruits and vegetables. Try to fill one half of your plate at each meal with fruits and vegetables. Eat whole grains, such as whole-wheat pasta, brown rice, or whole-grain bread. Fill about one fourth of your plate with whole grains. Eat or drink low-fat dairy products, such as skim milk or low-fat yogurt. Avoid fatty cuts of meat, processed or cured meats, and poultry with skin. Fill about one fourth of your plate with lean proteins, such as fish, chicken without skin, beans, eggs, or tofu. Avoid pre-made and processed foods. These tend to be higher in sodium, added sugar, and fat. Reduce your daily sodium intake. Many people with hypertension should eat less than 1,500 mg of sodium a day. Do not drink alcohol if: Your health care provider tells you not to drink. You are pregnant, may be pregnant, or are planning to become pregnant. If you drink alcohol: Limit how much you have to: 0-1 drink a day for women. 0-2 drinks a day for men. Know how much alcohol is in your drink. In the [place] one drink equals one 12 oz bottle of beer (355 mL), one 5 oz glass of wine (148 mL), or one 1 oz glass of hard liquor (44 mL). Lifestyle  Work with your health care provider to maintain a healthy body weight or to lose weight. Ask what an ideal weight is for you. Get at least 30 minutes of exercise that causes your heart to beat faster (aerobic exercise) most days of the week. Activities may include walking, swimming, or biking. Include exercise to strengthen your muscles (resistance exercise), such as Pilates or lifting weights, as part of your weekly exercise routine. Try to do these types of exercises for 30 minutes at least 3 days a week. Do not use any products that contain nicotine or tobacco. These products include cigarettes,  chewing tobacco, and vaping devices, such as e-cigarettes. If you need help quitting, ask your health care provider. Monitor your blood pressure at home as told by your health care provider. Keep all follow-up visits. This is important. Medicines Take over-the-counter and prescription medicines only as told by your health care provider. Follow directions carefully. Blood pressure medicines must be taken as prescribed. Do not skip doses of blood pressure medicine. Doing this puts you at risk for problems and can make the medicine less effective. Ask your health care provider about side effects or reactions to medicines that you should watch for. Contact a health care provider if you: Think you are having a reaction to a medicine you are taking. Have headaches that keep coming back (recurring). Feel dizzy. Have swelling in your ankles. Have trouble with your vision. Get help right away if you: Develop a severe headache or confusion. Have unusual weakness or numbness. Feel faint. Have severe pain in your chest or abdomen. Vomit repeatedly. Have trouble breathing. These symptoms may be an emergency. Get help right away. Call 911. Do not wait to see if the symptoms will go away. Do not drive yourself to the hospital. Summary Hypertension is when the force of blood pumping through your arteries is  too strong. If this condition is not controlled, it may put you at risk for serious complications. Your personal target blood pressure may vary depending on your medical conditions, your age, and other factors. For most people, a normal blood pressure is less than 120/80. Hypertension is treated with lifestyle changes, medicines, or a combination of both. Lifestyle changes include losing weight, eating a healthy, low-sodium diet, exercising more, and limiting alcohol. This information is not intended to replace advice given to you by your health care provider. Make sure you discuss any questions you  have with your health care provider. Document Revised: 07/25/2021 Document Reviewed: 07/25/2021 Elsevier Patient Education  2024 [employer].

## 2024-02-18 ENCOUNTER — Ambulatory Visit: Payer: Self-pay | Admitting: Family Medicine

## 2024-02-18 DIAGNOSIS — L905 Scar conditions and fibrosis of skin: Secondary | ICD-10-CM | POA: Diagnosis not present

## 2024-02-18 LAB — CBC WITH DIFFERENTIAL/PLATELET
Basophils Absolute: 0.1 10*3/uL (ref 0.0–0.1)
Basophils Relative: 1.3 % (ref 0.0–3.0)
Eosinophils Absolute: 0 10*3/uL (ref 0.0–0.7)
Eosinophils Relative: 0.5 % (ref 0.0–5.0)
HCT: 42.4 % (ref 39.0–52.0)
Hemoglobin: 14.1 g/dL (ref 13.0–17.0)
Lymphocytes Relative: 31.8 % (ref 12.0–46.0)
Lymphs Abs: 1.7 10*3/uL (ref 0.7–4.0)
MCHC: 33.2 g/dL (ref 30.0–36.0)
MCV: 92.4 fl (ref 78.0–100.0)
Monocytes Absolute: 1.1 10*3/uL — ABNORMAL HIGH (ref 0.1–1.0)
Monocytes Relative: 20.6 % — ABNORMAL HIGH (ref 3.0–12.0)
Neutro Abs: 2.5 10*3/uL (ref 1.4–7.7)
Neutrophils Relative %: 45.8 % (ref 43.0–77.0)
Platelets: 117 10*3/uL — ABNORMAL LOW (ref 150.0–400.0)
RBC: 4.59 Mil/uL (ref 4.22–5.81)
RDW: 14.6 % (ref 11.5–15.5)
WBC: 5.5 10*3/uL (ref 4.0–10.5)

## 2024-02-18 LAB — COMPREHENSIVE METABOLIC PANEL WITH GFR
ALT: 19 U/L (ref 0–53)
AST: 20 U/L (ref 0–37)
Albumin: 4.6 g/dL (ref 3.5–5.2)
Alkaline Phosphatase: 64 U/L (ref 39–117)
BUN: 32 mg/dL — ABNORMAL HIGH (ref 6–23)
CO2: 25 meq/L (ref 19–32)
Calcium: 9.7 mg/dL (ref 8.4–10.5)
Chloride: 111 meq/L (ref 96–112)
Creatinine, Ser: 2.15 mg/dL — ABNORMAL HIGH (ref 0.40–1.50)
GFR: 28.11 mL/min — ABNORMAL LOW (ref >60.00)
Glucose, Bld: 108 mg/dL — ABNORMAL HIGH (ref 70–99)
Potassium: 4.9 meq/L (ref 3.5–5.1)
Sodium: 146 meq/L — ABNORMAL HIGH (ref 135–145)
Total Bilirubin: 0.8 mg/dL (ref 0.2–1.2)
Total Protein: 7 g/dL (ref 6.0–8.3)

## 2024-02-18 LAB — HEMOGLOBIN A1C: Hgb A1c MFr Bld: 5.9 % (ref 4.6–6.5)

## 2024-02-18 LAB — LIPID PANEL
Cholesterol: 164 mg/dL (ref 0–200)
HDL: 36.2 mg/dL — ABNORMAL LOW (ref >39.00)
LDL Cholesterol: 84 mg/dL (ref 0–99)
NonHDL: 127.68
Total CHOL/HDL Ratio: 5
Triglycerides: 219 mg/dL — ABNORMAL HIGH (ref 0.0–149.0)
VLDL: 43.8 mg/dL — ABNORMAL HIGH (ref 0.0–40.0)

## 2024-02-18 LAB — VITAMIN D 25 HYDROXY (VIT D DEFICIENCY, FRACTURES): VITD: 32.92 ng/mL (ref 30.00–100.00)

## 2024-03-12 DIAGNOSIS — R809 Proteinuria, unspecified: Secondary | ICD-10-CM | POA: Diagnosis not present

## 2024-03-12 DIAGNOSIS — N183 Chronic kidney disease, stage 3 unspecified: Secondary | ICD-10-CM | POA: Diagnosis not present

## 2024-04-13 ENCOUNTER — Other Ambulatory Visit: Payer: Self-pay | Admitting: Family Medicine

## 2024-04-14 ENCOUNTER — Ambulatory Visit (INDEPENDENT_AMBULATORY_CARE_PROVIDER_SITE_OTHER)

## 2024-04-14 VITALS — Ht 66.0 in | Wt 192.0 lb

## 2024-04-14 DIAGNOSIS — Z0000 Encounter for general adult medical examination without abnormal findings: Secondary | ICD-10-CM | POA: Diagnosis not present
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
## 2024-04-14 NOTE — Patient Instructions (Addendum)
 Edward Hernandez , Thank you for taking time out of your busy schedule to complete your Annual Wellness Visit with me. I enjoyed our conversation and look forward to speaking with you again next year. I, as well as your care team,  appreciate your ongoing commitment to your health goals. Please review the following plan we discussed and let me know if I can assist you in the future. Your Game plan/ To Do List    Referrals: If you haven't heard from the office you've been referred to, please reach out to them at the phone provided.   Follow up Visits: Next Medicare AWV with our clinical staff: 04/20/25 @ 8:50a   Have you seen your provider in the last 6 months (3 months if uncontrolled diabetes)?  Next Office Visit with your provider: 04/30/24 @ 10a  Clinician Recommendations:  Aim for 30 minutes of exercise or brisk walking, 6-8 glasses of water , and 5 servings of fruits and vegetables each day.       This is a list of the screening recommended for you and due dates:  Health Maintenance  Topic Date Due   Yearly kidney health urinalysis for diabetes  09/13/2012   Zoster (Shingles) Vaccine (2 of 2) 05/30/2021   COVID-19 Vaccine (4 - 2024-25 season) 06/02/2023   Flu Shot  05/01/2024   Yearly kidney function blood test for diabetes  02/16/2025   Medicare Annual Wellness Visit  04/14/2025   DTaP/Tdap/Td vaccine (2 - Tdap) 12/01/2025   Pneumococcal Vaccine for age over 45  Completed   Hepatitis B Vaccine  Aged Out   HPV Vaccine  Aged Out   Meningitis B Vaccine  Aged Out   Complete foot exam   Discontinued   Hemoglobin A1C  Discontinued   Eye exam for diabetics  Discontinued   Hepatitis C Screening  Discontinued    Advanced directives: (In Chart) A copy of your advanced directives are scanned into your chart should your provider ever need it. Advance Care Planning is important because it:  [x]  Makes sure you receive the medical care that is consistent with your values, goals, and  preferences  [x]  It provides guidance to your family and loved ones and reduces their decisional burden about whether or not they are making the right decisions based on your wishes.  Follow the link provided in your after visit summary or read over the paperwork we have mailed to you to help you started getting your Advance Directives in place. If you need assistance in completing these, please reach out to us  so that we can help you!  See attachments for Preventive Care and Fall Prevention Tips.

## 2024-04-21 DIAGNOSIS — L905 Scar conditions and fibrosis of skin: Secondary | ICD-10-CM | POA: Diagnosis not present

## 2024-04-30 ENCOUNTER — Encounter: Payer: Medicare Other | Admitting: Family Medicine

## 2024-06-05 DIAGNOSIS — R809 Proteinuria, unspecified: Secondary | ICD-10-CM | POA: Diagnosis not present

## 2024-06-09 DIAGNOSIS — R339 Retention of urine, unspecified: Secondary | ICD-10-CM | POA: Diagnosis not present

## 2024-06-09 DIAGNOSIS — N183 Chronic kidney disease, stage 3 unspecified: Secondary | ICD-10-CM | POA: Diagnosis not present

## 2024-06-09 DIAGNOSIS — I10 Essential (primary) hypertension: Secondary | ICD-10-CM | POA: Diagnosis not present

## 2024-06-09 DIAGNOSIS — E118 Type 2 diabetes mellitus with unspecified complications: Secondary | ICD-10-CM | POA: Diagnosis not present

## 2024-06-09 DIAGNOSIS — N390 Urinary tract infection, site not specified: Secondary | ICD-10-CM | POA: Diagnosis not present

## 2024-06-11 DIAGNOSIS — R338 Other retention of urine: Secondary | ICD-10-CM | POA: Diagnosis not present

## 2024-06-11 DIAGNOSIS — R3914 Feeling of incomplete bladder emptying: Secondary | ICD-10-CM | POA: Diagnosis not present

## 2024-08-12 NOTE — Progress Notes (Signed)
 Edward Hernandez                                          MRN: [id number]   08/12/2024   The [facility] Quality Team Specialist reviewed this patient medical record for the purposes of chart review for care gap closure. The following were reviewed: chart review for care gap closure-kidney health evaluation for diabetes:eGFR  and uACR.    VBCI Quality Team

## 2024-08-21 ENCOUNTER — Encounter: Payer: Self-pay | Admitting: *Deleted

## 2024-08-21 NOTE — Progress Notes (Signed)
 Edward Hernandez                                          MRN: [id number]   08/21/2024   The VBCI Quality Team Specialist reviewed this patient medical record for the purposes of chart review for care gap closure. The following were reviewed: chart review for care gap closure-kidney health evaluation for diabetes:eGFR  and uACR.    VBCI Quality Team

## 2024-09-12 ENCOUNTER — Other Ambulatory Visit: Payer: Self-pay | Admitting: Family Medicine

## 2024-10-15 ENCOUNTER — Encounter: Payer: Self-pay | Admitting: Family Medicine

## 2024-10-20 ENCOUNTER — Other Ambulatory Visit: Payer: Self-pay | Admitting: Family

## 2024-10-20 DIAGNOSIS — L570 Actinic keratosis: Secondary | ICD-10-CM

## 2024-12-03 ENCOUNTER — Encounter: Admitting: Family Medicine

## 2024-12-03 ENCOUNTER — Encounter: Admitting: Physician Assistant

## 2025-04-20 ENCOUNTER — Ambulatory Visit
